# Patient Record
Sex: Female | Born: 1980 | Race: Black or African American | Hispanic: No | Marital: Married | Smoking: Former smoker
Health system: Southern US, Community
[De-identification: ages and names within clinical notes are randomized; demographics above are authoritative.]

## PROBLEM LIST (undated history)

## (undated) ENCOUNTER — Inpatient Hospital Stay (HOSPITAL_COMMUNITY): Payer: Self-pay

## (undated) DIAGNOSIS — J189 Pneumonia, unspecified organism: Secondary | ICD-10-CM

## (undated) DIAGNOSIS — J45909 Unspecified asthma, uncomplicated: Secondary | ICD-10-CM

## (undated) DIAGNOSIS — D649 Anemia, unspecified: Secondary | ICD-10-CM

---

## 2014-01-05 ENCOUNTER — Encounter (HOSPITAL_COMMUNITY): Payer: Self-pay | Admitting: *Deleted

## 2014-01-05 ENCOUNTER — Other Ambulatory Visit: Payer: Self-pay | Admitting: Obstetrics and Gynecology

## 2014-01-09 ENCOUNTER — Encounter (HOSPITAL_COMMUNITY): Payer: Self-pay | Admitting: *Deleted

## 2014-01-09 ENCOUNTER — Encounter (HOSPITAL_COMMUNITY): Payer: Medicaid Other | Admitting: Anesthesiology

## 2014-01-09 ENCOUNTER — Inpatient Hospital Stay (HOSPITAL_COMMUNITY): Payer: Medicaid Other | Admitting: Anesthesiology

## 2014-01-09 ENCOUNTER — Encounter (HOSPITAL_COMMUNITY): Payer: Self-pay | Admitting: Pharmacist

## 2014-01-09 ENCOUNTER — Ambulatory Visit (HOSPITAL_COMMUNITY)
Admission: AD | Admit: 2014-01-09 | Discharge: 2014-01-09 | Disposition: A | Discharge status: Home / Self Care | Payer: Medicaid Other | Source: Ambulatory Visit | Attending: Obstetrics and Gynecology | Admitting: Obstetrics and Gynecology

## 2014-01-09 ENCOUNTER — Encounter (HOSPITAL_COMMUNITY)
Admission: AD | Disposition: A | Discharge status: Home / Self Care | Payer: Self-pay | Source: Ambulatory Visit | Attending: Obstetrics and Gynecology

## 2014-01-09 DIAGNOSIS — O021 Missed abortion: Secondary | ICD-10-CM | POA: Insufficient documentation

## 2014-01-09 DIAGNOSIS — Z87891 Personal history of nicotine dependence: Secondary | ICD-10-CM | POA: Diagnosis not present

## 2014-01-09 HISTORY — PX: DILATION AND EVACUATION: SHX1459

## 2014-01-09 SURGERY — DILATION AND EVACUATION, UTERUS
Anesthesia: Monitor Anesthesia Care | Site: Vagina

## 2014-01-09 MED ORDER — OXYCODONE-ACETAMINOPHEN 5-325 MG PO TABS
2.0000 | ORAL_TABLET | ORAL | Status: DC | PRN
  Administered 2014-01-09: 2 via ORAL

## 2014-01-09 MED ORDER — LIDOCAINE HCL (CARDIAC) 20 MG/ML IV SOLN
INTRAVENOUS | Status: AC
  Filled 2014-01-09: qty 5

## 2014-01-09 MED ORDER — MIDAZOLAM HCL 2 MG/2ML IJ SOLN: 0.5000 mg | Freq: Once | INTRAMUSCULAR | Status: DC | PRN

## 2014-01-09 MED ORDER — LIDOCAINE HCL 1 % IJ SOLN
INTRAMUSCULAR | Status: DC | PRN
  Administered 2014-01-09: 10 mL

## 2014-01-09 MED ORDER — ONDANSETRON HCL 4 MG/2ML IJ SOLN
INTRAMUSCULAR | Status: DC | PRN
  Administered 2014-01-09: 4 mg via INTRAVENOUS

## 2014-01-09 MED ORDER — FENTANYL CITRATE 0.05 MG/ML IJ SOLN
INTRAMUSCULAR | Status: AC
  Filled 2014-01-09: qty 2

## 2014-01-09 MED ORDER — DEXAMETHASONE SODIUM PHOSPHATE 10 MG/ML IJ SOLN
INTRAMUSCULAR | Status: AC
  Filled 2014-01-09: qty 1

## 2014-01-09 MED ORDER — KETOROLAC TROMETHAMINE 30 MG/ML IJ SOLN
15.0000 mg | Freq: Once | INTRAMUSCULAR | Status: AC | PRN
  Administered 2014-01-09: 30 mg via INTRAVENOUS

## 2014-01-09 MED ORDER — OXYCODONE-ACETAMINOPHEN 5-325 MG PO TABS
ORAL_TABLET | ORAL | Status: AC
  Filled 2014-01-09: qty 2

## 2014-01-09 MED ORDER — FENTANYL CITRATE 0.05 MG/ML IJ SOLN
INTRAMUSCULAR | Status: DC | PRN
  Administered 2014-01-09: 100 ug via INTRAVENOUS

## 2014-01-09 MED ORDER — PROMETHAZINE HCL 25 MG/ML IJ SOLN: 6.2500 mg | INTRAMUSCULAR | Status: DC | PRN

## 2014-01-09 MED ORDER — LACTATED RINGERS IV SOLN
INTRAVENOUS | Status: DC
  Administered 2014-01-09 (×2): via INTRAVENOUS

## 2014-01-09 MED ORDER — DEXAMETHASONE SODIUM PHOSPHATE 4 MG/ML IJ SOLN
INTRAMUSCULAR | Status: DC | PRN
  Administered 2014-01-09: 10 mg via INTRAVENOUS

## 2014-01-09 MED ORDER — ONDANSETRON HCL 4 MG/2ML IJ SOLN
INTRAMUSCULAR | Status: AC
  Filled 2014-01-09: qty 2

## 2014-01-09 MED ORDER — KETOROLAC TROMETHAMINE 30 MG/ML IJ SOLN
INTRAMUSCULAR | Status: AC
  Administered 2014-01-09: 30 mg via INTRAVENOUS
  Filled 2014-01-09: qty 1

## 2014-01-09 MED ORDER — PROPOFOL 10 MG/ML IV EMUL
INTRAVENOUS | Status: AC
  Filled 2014-01-09: qty 20

## 2014-01-09 MED ORDER — PROPOFOL 10 MG/ML IV BOLUS
INTRAVENOUS | Status: DC | PRN
  Administered 2014-01-09 (×2): 30 mg via INTRAVENOUS
  Administered 2014-01-09: 40 mg via INTRAVENOUS
  Administered 2014-01-09 (×2): 30 mg via INTRAVENOUS
  Administered 2014-01-09: 20 mg via INTRAVENOUS
  Administered 2014-01-09: 40 mg via INTRAVENOUS
  Administered 2014-01-09: 20 mg via INTRAVENOUS
  Administered 2014-01-09: 60 mg via INTRAVENOUS

## 2014-01-09 MED ORDER — MIDAZOLAM HCL 5 MG/5ML IJ SOLN
INTRAMUSCULAR | Status: DC | PRN
  Administered 2014-01-09 (×2): 1 mg via INTRAVENOUS

## 2014-01-09 MED ORDER — LIDOCAINE HCL (CARDIAC) 20 MG/ML IV SOLN
INTRAVENOUS | Status: DC | PRN
  Administered 2014-01-09: 40 mg via INTRAVENOUS
  Administered 2014-01-09: 20 mg via INTRAVENOUS

## 2014-01-09 MED ORDER — IBUPROFEN 600 MG PO TABS: 600.0000 mg | ORAL_TABLET | Freq: Four times a day (QID) | ORAL | Status: DC | PRN

## 2014-01-09 MED ORDER — FENTANYL CITRATE 0.05 MG/ML IJ SOLN
25.0000 ug | INTRAMUSCULAR | Status: DC | PRN
Start: 2014-01-09 — End: 2014-01-10
  Administered 2014-01-09: 50 ug via INTRAVENOUS

## 2014-01-09 MED ORDER — OXYCODONE-ACETAMINOPHEN 5-325 MG PO TABS: 2.0000 | ORAL_TABLET | ORAL | Status: DC | PRN

## 2014-01-09 MED ORDER — MEPERIDINE HCL 25 MG/ML IJ SOLN: 6.2500 mg | INTRAMUSCULAR | Status: DC | PRN

## 2014-01-09 MED ORDER — MIDAZOLAM HCL 2 MG/2ML IJ SOLN
INTRAMUSCULAR | Status: AC
  Filled 2014-01-09: qty 2

## 2014-01-09 SURGICAL SUPPLY — 17 items
CATH ROBINSON RED A/P 16FR (CATHETERS) ×2 IMPLANT
CLOTH BEACON ORANGE TIMEOUT ST (SAFETY) ×2 IMPLANT
DECANTER SPIKE VIAL GLASS SM (MISCELLANEOUS) ×2 IMPLANT
DILATOR CANAL MILEX (MISCELLANEOUS) IMPLANT
GLOVE BIO SURGEON STRL SZ7 (GLOVE) ×2 IMPLANT
GOWN STRL REUS W/TWL LRG LVL3 (GOWN DISPOSABLE) ×4 IMPLANT
KIT BERKELEY 1ST TRIMESTER 3/8 (MISCELLANEOUS) ×2 IMPLANT
NS IRRIG 1000ML POUR BTL (IV SOLUTION) ×2 IMPLANT
PACK VAGINAL MINOR WOMEN LF (CUSTOM PROCEDURE TRAY) ×2 IMPLANT
PAD OB MATERNITY 4.3X12.25 (PERSONAL CARE ITEMS) ×2 IMPLANT
PAD PREP 24X48 CUFFED NSTRL (MISCELLANEOUS) ×2 IMPLANT
SET BERKELEY SUCTION TUBING (SUCTIONS) ×2 IMPLANT
TOWEL OR 17X24 6PK STRL BLUE (TOWEL DISPOSABLE) ×4 IMPLANT
VACURETTE 10 RIGID CVD (CANNULA) ×2 IMPLANT
VACURETTE 7MM CVD STRL WRAP (CANNULA) IMPLANT
VACURETTE 8 RIGID CVD (CANNULA) IMPLANT
VACURETTE 9 RIGID CVD (CANNULA) IMPLANT

## 2014-01-09 NOTE — MAU Provider Note (Signed)
Chief Complaint: Vaginal Bleeding and Abdominal Pain   First Provider Initiated Contact with Patient 01/09/14 1742     SUBJECTIVE HPI: Peggy Nguyen is a 33 y.o. G1P0 at 3987w5d by LMP who presents with onset dark red vaginal bleeding and lower abdominal cramping 3 hrs PTA. Known to have 10 wk size embryonic demise by office scan 01/05/14. Last ate at 0900 and has had only sips of water since then. Has D&E scheduled for 01/12/14; would like to proceed with D&E sooner.   Past Medical History  Diagnosis Date  . Medical history non-contributory    OB History  Gravida Para Term Preterm AB SAB TAB Ectopic Multiple Living  1             # Outcome Date GA Lbr Len/2nd Weight Sex Delivery Anes PTL Lv  1 CUR              Past Surgical History  Procedure Laterality Date  . No past surgeries     History   Social History  . Marital Status: Divorced    Spouse Name: N/A    Number of Children: N/A  . Years of Education: N/A   Occupational History  . Not on file.   Social History Main Topics  . Smoking status: Former Smoker -- 0.50 packs/day for 5 years    Types: Cigarettes    Quit date: 11/03/2013  . Smokeless tobacco: Never Used  . Alcohol Use: No  . Drug Use: No  . Sexual Activity: Yes    Birth Control/ Protection: None     Comment: approx [redacted] wks gestation   Other Topics Concern  . Not on file   Social History Narrative  . No narrative on file   No current facility-administered medications on file prior to encounter.   Current Outpatient Prescriptions on File Prior to Encounter  Medication Sig Dispense Refill  . acetaminophen (TYLENOL) 500 MG tablet Take 1,000 mg by mouth every 6 (six) hours as needed for moderate pain.       No Known Allergies  ROS: Pertinent items in HPI  OBJECTIVE Blood pressure 126/58, pulse 93, temperature 98.7 F (37.1 C), temperature source Oral, resp. rate 18, height 4' 11.5" (1.511 m), weight 71.385 kg (157 lb 6 oz), last menstrual period  10/12/2013. GENERAL: Well-developed, well-nourished female in no acute distress.  HEENT: Normocephalic HEART: normal rate RESP: normal effort ABDOMEN: Soft, non-tender EXTREMITIES: Nontender, no edema NEURO: Alert and oriented SPECULUM EXAM: NEFG, small amount dark blood , no active bleeding SVE: L/C uterus 8 wk size, NT  LAB RESULTS No results found for this or any previous visit (from the past 24 hour(s)).  IMAGING Bedside scan > embryo with no HR in IUGS  MAU COURSE Consulted Dr. Tenny Crawoss who will do D&E  ASSESSMENT 1. Missed ab     PLAN D&E   Danae OrleansDeirdre C Poe, CNM 01/09/2014  5:43 PM

## 2014-01-09 NOTE — Anesthesia Preprocedure Evaluation (Addendum)
Anesthesia Evaluation  Patient identified by MRN, date of birth, ID band Patient awake    Reviewed: Allergy & Precautions, H&P , Patient's Chart, lab work & pertinent test results, reviewed documented beta blocker date and time   History of Anesthesia Complications Negative for: history of anesthetic complications  Airway Mallampati: II  TM Distance: >3 FB Neck ROM: full    Dental   Pulmonary former smoker,  breath sounds clear to auscultation        Cardiovascular Exercise Tolerance: Good Rhythm:regular Rate:Normal     Neuro/Psych negative psych ROS   GI/Hepatic   Endo/Other    Renal/GU      Musculoskeletal   Abdominal   Peds  Hematology   Anesthesia Other Findings   Reproductive/Obstetrics                             Anesthesia Physical Anesthesia Plan  ASA: II  Anesthesia Plan: MAC   Post-op Pain Management:    Induction:   Airway Management Planned:   Additional Equipment:   Intra-op Plan:   Post-operative Plan:   Informed Consent: I have reviewed the patients History and Physical, chart, labs and discussed the procedure including the risks, benefits and alternatives for the proposed anesthesia with the patient or authorized representative who has indicated his/her understanding and acceptance.   Dental Advisory Given  Plan Discussed with: CRNA, Surgeon and Anesthesiologist  Anesthesia Plan Comments:         Anesthesia Quick Evaluation  

## 2014-01-09 NOTE — MAU Note (Signed)
Patient presents with complaints of vaginal bleeding since 3pm today and abdominal cramping since 3:30pm today.

## 2014-01-09 NOTE — Op Note (Signed)
Pre-Operative Diagnosis: 1) 10 week Missed abortion Postoperative Diagnosis: 2) Same Procedure: Suction Dilation and Evacuation Surgeon: Dr. Waynard ReedsKendra Edon Hoadley Assistant: None Operative Findings: Products of conception Specimen:Products of conception EBL: Total I/O In: 1000 [I.V.:1000] Out: 175 [Urine:150; Blood:25]   Ms. Peggy Nguyen Is a 33 year old gravida 1 para 0 who presents for definitive surgical management for a 10 week missed abortion. Please see the patient's history and physical for complete details of the history. Management options were discussed with the patient. R/B/A reviewed. Following appropriate informed consent was taken to the operating room. The patient was appropriately identified during a time out procedure. MAC anesthesia was administered and the patient was placed in the dorsal lithotomy position. The patient was prepped and draped in the normal sterile fashion. A speculum was placed into the vagina, a single-tooth tenaculum was placed on the anterior lip of the cervix, and 10 cc of 1% lidocaine was administered in a paracervical fashion. The cervix was serially dilated with Shawnie PonsPratt dilators. A #10 suction curet was then passed to the fundus, the vacuum was engaged, and 3 suction passes were performed with a curette. A Sharp curettage was performed and a gritty texture was noted. A final suction pass was performed with minimal results. This completed the procedure. The patient tolerated the procedure well was brought to the recovery room in stable condition for the procedure. All sponge and needle counts correct x2.

## 2014-01-09 NOTE — Anesthesia Postprocedure Evaluation (Signed)
  Anesthesia Post-op Note  Anesthesia Post Note  Patient: Peggy Nguyen  Procedure(s) Performed: Procedure(s) (LRB): DILATATION AND EVACUATION (N/A)  Anesthesia type: MAC  Patient location: PACU  Post pain: Pain level controlled  Post assessment: Post-op Vital signs reviewed  Last Vitals:  Filed Vitals:   01/09/14 2100  BP: 116/79  Pulse: 90  Temp: 37.2 C  Resp: 18    Post vital signs: Reviewed  Level of consciousness: sedated  Complications: No apparent anesthesia complications

## 2014-01-09 NOTE — H&P (Signed)
Peggy Nguyen is an 33 y.o. female Presents for vaginal bleeding   33 year old gravida one pair zero African-American female presents to maternity admissions for evaluation  Of vaginal bleeding and cramping. She was seen and our office on  Monday, August 3. A transvaginal ultrasound demonstrated in intrauterine pregnancy measuring approximately 10 weeks however no fetal cardiac activity was visualized. This ultrasound findings were consistent with a missed abortion. Risks, benefits, alternatives of management options were discussed with the patient and she elected for surgical management, however due to work restrictions she was unable to have her surgery performed this week and she is scheduled for next week. Throughout the day today she has had increased abdominal cramping, vaginal pain, pain and she presented to the turned emissions for evaluation of finding. Patient wishes to proceed with any time. Risks, benefits, alternative procedure would definitely   Patient's last menstrual period was 10/12/2013.    Past Medical History  Diagnosis Date  . Medical history non-contributory     Past Surgical History  Procedure Laterality Date  . No past surgeries      History reviewed. No pertinent family history.  Social History:  reports that she quit smoking about 2 months ago. Her smoking use included Cigarettes. She has a 2.5 pack-year smoking history. She has never used smokeless tobacco. She reports that she does not drink alcohol or use illicit drugs.  Allergies: No Known Allergies  Prescriptions prior to admission  Medication Sig Dispense Refill  . acetaminophen (TYLENOL) 500 MG tablet Take 1,000 mg by mouth every 6 (six) hours as needed for moderate pain.      . Prenatal Vit-Fe Fumarate-FA (PRENATAL MULTIVITAMIN) TABS tablet Take 1 tablet by mouth daily.        ROS: as above  Blood pressure 126/58, pulse 93, temperature 98.7 F (37.1 C), temperature source Oral, resp. rate 18, height  4' 11.5" (1.511 m), weight 71.385 kg (157 lb 6 oz), last menstrual period 10/12/2013. Physical Exam  AOX#, anxious appearing Vaginal bleeding noted on exam  BT O positive Hg 11.9   No results found for this or any previous visit (from the past 24 hour(s)).  No results found.  Assessment/Plan:  33 year old G1 P0 at 10 weeks with a missed abortion for suction dilation and curettage 1) Admit 2) Consent for procedure, R/B/A reviewed  Patient did not wish to have the products of conception sent for genetic testing. 3) SCDS  Thomasa Heidler H. 01/09/2014, 6:15 PM

## 2014-01-09 NOTE — Transfer of Care (Signed)
Immediate Anesthesia Transfer of Care Note  Patient: Peggy Nguyen  Procedure(s) Performed: Procedure(s): DILATATION AND EVACUATION (N/A)  Patient Location: PACU  Anesthesia Type:MAC  Level of Consciousness: awake and sedated  Airway & Oxygen Therapy: Patient Spontanous Breathing and Patient connected to nasal cannula oxygen  Post-op Assessment: Report given to PACU RN and Post -op Vital signs reviewed and stable  Post vital signs: Reviewed and stable  Complications: No apparent anesthesia complications

## 2014-01-12 ENCOUNTER — Encounter (HOSPITAL_COMMUNITY): Payer: Self-pay | Admitting: Anesthesiology

## 2014-01-12 ENCOUNTER — Ambulatory Visit (HOSPITAL_COMMUNITY)
Admission: RE | Admit: 2014-01-12 | Payer: Medicaid Other | Source: Ambulatory Visit | Admitting: Obstetrics and Gynecology

## 2014-01-12 ENCOUNTER — Encounter (HOSPITAL_COMMUNITY): Payer: Self-pay | Admitting: Obstetrics and Gynecology

## 2014-01-12 SURGERY — DILATION AND EVACUATION, UTERUS
Anesthesia: Choice

## 2014-02-10 ENCOUNTER — Other Ambulatory Visit: Payer: Self-pay | Admitting: Obstetrics and Gynecology

## 2014-02-11 LAB — CYTOLOGY - PAP

## 2014-04-07 ENCOUNTER — Encounter (HOSPITAL_COMMUNITY): Payer: Self-pay | Admitting: Obstetrics and Gynecology

## 2014-04-17 ENCOUNTER — Encounter (HOSPITAL_COMMUNITY): Payer: Self-pay | Admitting: *Deleted

## 2014-04-17 ENCOUNTER — Emergency Department (HOSPITAL_COMMUNITY): Payer: Medicaid Other

## 2014-04-17 ENCOUNTER — Emergency Department (HOSPITAL_COMMUNITY)
Admission: EM | Admit: 2014-04-17 | Discharge: 2014-04-17 | Disposition: A | Discharge status: Home / Self Care | Payer: Medicaid Other | Attending: Emergency Medicine | Admitting: Emergency Medicine

## 2014-04-17 DIAGNOSIS — R062 Wheezing: Secondary | ICD-10-CM

## 2014-04-17 DIAGNOSIS — R05 Cough: Secondary | ICD-10-CM

## 2014-04-17 DIAGNOSIS — J45901 Unspecified asthma with (acute) exacerbation: Secondary | ICD-10-CM | POA: Diagnosis not present

## 2014-04-17 DIAGNOSIS — Z87891 Personal history of nicotine dependence: Secondary | ICD-10-CM | POA: Insufficient documentation

## 2014-04-17 DIAGNOSIS — Z793 Long term (current) use of hormonal contraceptives: Secondary | ICD-10-CM | POA: Diagnosis not present

## 2014-04-17 DIAGNOSIS — R059 Cough, unspecified: Secondary | ICD-10-CM

## 2014-04-17 MED ORDER — ALBUTEROL SULFATE HFA 108 (90 BASE) MCG/ACT IN AERS
2.0000 | INHALATION_SPRAY | Freq: Once | RESPIRATORY_TRACT | Status: AC
  Administered 2014-04-17: 2 via RESPIRATORY_TRACT
  Filled 2014-04-17: qty 6.7

## 2014-04-17 NOTE — ED Provider Notes (Signed)
CSN: [id number]     Arrival date & time 04/17/14  [id number] History   First MD Initiated Contact with Patient 04/17/14 [phone]     Chief Complaint  Patient presents with  . Cough  . Nasal Congestion  . Wheezing     (Consider location/radiation/quality/duration/timing/severity/associated sxs/prior Treatment) HPI Comments: 33 year old female with asthma as a child, no significant medical history, past smoker presents with cough nonproductive and wheezing worsening for the past week. No previous history recalled except for possibly the child. Patient has no cardiac history or blood clot history, no recent major surgeries, no significant sick contact.worse at night, congested. Low-grade fever. Patient currently not on antibiotics or steroids.  Patient is a 33 y.o. female presenting with cough and wheezing. The history is provided by the patient.  Cough Associated symptoms: wheezing   Associated symptoms: no chest pain, no chills, no fever and no headaches   Wheezing Associated symptoms: cough   Associated symptoms: no chest pain, no fever and no headaches     Past Medical History  Diagnosis Date  . Medical history non-contributory    Past Surgical History  Procedure Laterality Date  . No past surgeries    . Dilation and evacuation N/A 01/09/2014    Procedure: DILATATION AND EVACUATION;  Surgeon: Freddrick MarchKendra H. Tenny Crawoss, MD;  Location: [facility];  Service: Gynecology;  Laterality: N/A;   No family history on file. History  Substance Use Topics  . Smoking status: Former Smoker -- 0.50 packs/day for 5 years    Types: Cigarettes    Quit date: 11/03/2013  . Smokeless tobacco: Never Used  . Alcohol Use: No   OB History    Gravida Para Term Preterm AB TAB SAB Ectopic Multiple Living   1              Review of Systems  Constitutional: Negative for fever and chills.  HENT: Positive for congestion.   Respiratory: Positive for cough and wheezing.   Cardiovascular: Negative for chest pain and leg  swelling.  Gastrointestinal: Negative for vomiting.  Neurological: Negative for headaches.      Allergies  Review of patient's allergies indicates no known allergies.  Home Medications   Prior to Admission medications   Medication Sig Start Date End Date Taking? Authorizing Provider  acetaminophen (TYLENOL) 500 MG tablet Take 1,000 mg by mouth every 6 (six) hours as needed for moderate pain or headache.    Yes Historical Provider, MD  Norethindrone Acetate-Ethinyl Estrad-FE (LOESTRIN 24 FE) 1-20 MG-MCG(24) tablet Take 1 tablet by mouth daily.   Yes Historical Provider, MD   BP 136/78 mmHg  Pulse 86  Temp(Src) 97.9 F (36.6 C) (Oral)  Resp 18  SpO2 100%  LMP 04/09/2014  Breastfeeding? Unknown Physical Exam  Constitutional: She is oriented to person, place, and time. She appears well-developed and well-nourished.  HENT:  Head: Normocephalic and atraumatic.  Eyes: Conjunctivae are normal. Right eye exhibits no discharge. Left eye exhibits no discharge.  Neck: Normal range of motion. Neck supple. No tracheal deviation present.  Cardiovascular: Normal rate and regular rhythm.   Pulmonary/Chest: Effort normal. She has wheezes (expiratory wheeze bilateral, no retractions).  Abdominal: Soft. She exhibits no distension.  Musculoskeletal: She exhibits no edema.  Neurological: She is alert and oriented to person, place, and time.  Skin: Skin is warm. No rash noted.  Psychiatric: She has a normal mood and affect.  Nursing note and vitals reviewed.   ED Course  Procedures (including critical care time) Labs  Review Labs Reviewed - No data to display  Imaging Review Dg Chest 2 View  04/17/2014   CLINICAL DATA:  Shortness of breath, dry cough  EXAM: CHEST  2 VIEW  COMPARISON:  None.  FINDINGS: Lungs are clear.  No pleural effusion or pneumothorax.  The heart is normal in size.  Visualized osseous structures are within normal limits.  IMPRESSION: Normal chest radiographs.    Electronically Signed   By: Charline BillsSriyesh  Krishnan M.D.   On: 04/17/2014 07:48     EKG Interpretation None      MDM   Final diagnoses:  Cough  Wheezing   Well-appearing femalechildhood asthma presents with wheezing and cough for a week. Clinically discussed likely viral/bacterial with bronchospasm versus recurrence of asthma.  Patient given inhaler in the ER. Patient has no history of chest x-ray, chest x-ray reviewed no acute findings.  Results and differential diagnosis were discussed with the patient/parent/guardian. Close follow up outpatient was discussed, comfortable with the plan.   Medications  albuterol (PROVENTIL HFA;VENTOLIN HFA) 108 (90 BASE) MCG/ACT inhaler 2 puff (2 puffs Inhalation Given 04/17/14 0722)    Filed Vitals:   04/17/14 0703 04/17/14 0722 04/17/14 0725  BP: 136/78    Pulse: 86  86  Temp: 97.9 F (36.6 C)    TempSrc: Oral    Resp: 18  18  SpO2: 100% 100% 100%    Final diagnoses:  Cough  Wheezing        Enid SkeensJoshua M Winfield Caba, MD 04/17/14 [phone]

## 2014-04-17 NOTE — ED Notes (Signed)
Pt reports non-productive cough, nasal congestion and wheezing since Monday, sts hx of asthma in past, didn't use inhaler in over 8-9 years. Pt in NAD in triage, speaking in full sentences

## 2014-04-17 NOTE — Discharge Instructions (Signed)
Use inhaler every 3-4 hours as needed. If breathing worsens or new concerns come back to the ER see a primary care doctor.  If you were given medicines take as directed.  If you are on coumadin or contraceptives realize their levels and effectiveness is altered by many different medicines.  If you have any reaction (rash, tongues swelling, other) to the medicines stop taking and see a physician.   Please follow up as directed and return to the ER or see a physician for new or worsening symptoms.  Thank you. Filed Vitals:   04/17/14 0703 04/17/14 0722 04/17/14 0725  BP: 136/78    Pulse: 86  86  Temp: 97.9 F (36.6 C)    TempSrc: Oral    Resp: 18  18  SpO2: 100% 100% 100%

## 2014-09-23 ENCOUNTER — Other Ambulatory Visit: Payer: Self-pay | Admitting: Obstetrics and Gynecology

## 2014-10-03 ENCOUNTER — Inpatient Hospital Stay (HOSPITAL_COMMUNITY): Payer: Medicaid Other

## 2014-10-03 ENCOUNTER — Encounter (HOSPITAL_COMMUNITY): Payer: Self-pay | Admitting: *Deleted

## 2014-10-03 ENCOUNTER — Inpatient Hospital Stay (HOSPITAL_COMMUNITY)
Admission: AD | Admit: 2014-10-03 | Discharge: 2014-10-03 | Disposition: A | Discharge status: Home / Self Care | Payer: Medicaid Other | Source: Ambulatory Visit | Attending: Obstetrics | Admitting: Obstetrics

## 2014-10-03 DIAGNOSIS — O09291 Supervision of pregnancy with other poor reproductive or obstetric history, first trimester: Secondary | ICD-10-CM

## 2014-10-03 DIAGNOSIS — O26899 Other specified pregnancy related conditions, unspecified trimester: Secondary | ICD-10-CM

## 2014-10-03 DIAGNOSIS — Z87891 Personal history of nicotine dependence: Secondary | ICD-10-CM | POA: Diagnosis not present

## 2014-10-03 DIAGNOSIS — O9989 Other specified diseases and conditions complicating pregnancy, childbirth and the puerperium: Secondary | ICD-10-CM | POA: Insufficient documentation

## 2014-10-03 DIAGNOSIS — Z3A08 8 weeks gestation of pregnancy: Secondary | ICD-10-CM | POA: Insufficient documentation

## 2014-10-03 DIAGNOSIS — R109 Unspecified abdominal pain: Secondary | ICD-10-CM | POA: Diagnosis not present

## 2014-10-03 DIAGNOSIS — O09521 Supervision of elderly multigravida, first trimester: Secondary | ICD-10-CM | POA: Diagnosis not present

## 2014-10-03 HISTORY — DX: Unspecified asthma, uncomplicated: J45.909

## 2014-10-03 LAB — URINALYSIS, ROUTINE W REFLEX MICROSCOPIC
Bilirubin Urine: NEGATIVE
GLUCOSE, UA: NEGATIVE mg/dL
Hgb urine dipstick: NEGATIVE
Ketones, ur: NEGATIVE mg/dL
LEUKOCYTES UA: NEGATIVE
NITRITE: NEGATIVE
PROTEIN: NEGATIVE mg/dL
Specific Gravity, Urine: 1.015 (ref 1.005–1.030)
Urobilinogen, UA: 0.2 mg/dL (ref 0.0–1.0)
pH: 6.5 (ref 5.0–8.0)

## 2014-10-03 LAB — WET PREP, GENITAL
CLUE CELLS WET PREP: NONE SEEN
Trich, Wet Prep: NONE SEEN
Yeast Wet Prep HPF POC: NONE SEEN

## 2014-10-03 LAB — POCT PREGNANCY, URINE: Preg Test, Ur: POSITIVE — AB

## 2014-10-03 NOTE — MAU Note (Signed)
Was seen on Wed at dr's office for this preg. last couple days has been having some really bad cramping.  Paranoid, with last preg had a miscarriage

## 2014-10-03 NOTE — MAU Provider Note (Signed)
History     CSN: [id number]  Arrival date and time: 10/03/14 1114   First Provider Initiated Contact with Patient 10/03/14 1155      Chief Complaint  Patient presents with  . Abdominal Cramping   HPI 34 y.o. G2P0010 at [redacted] weeks EGA w/ cramping x 2 days, states it comes and goes, mostly right sided, no bleeding or discharge. Normal pelvic exam and u/s in office last week. H/O SAB around 10 weeks in last pregnancy.   Past Medical History  Diagnosis Date  . Medical history non-contributory   . Asthma     childhood    Past Surgical History  Procedure Laterality Date  . No past surgeries    . Dilation and evacuation N/A 01/09/2014    Procedure: DILATATION AND EVACUATION;  Surgeon: Freddrick March. Tenny Craw, MD;  Location: [facility];  Service: Gynecology;  Laterality: N/A;  . Dilation and curettage of uterus      Family History  Problem Relation Age of Onset  . Asthma Mother   . Lupus Sister   . Cancer Maternal Grandfather     prostate  . Diabetes Paternal Grandfather   . Hearing loss Neg Hx     History  Substance Use Topics  . Smoking status: Former Smoker -- 0.50 packs/day for 5 years    Types: Cigarettes    Quit date: 11/03/2013  . Smokeless tobacco: Never Used  . Alcohol Use: No    Allergies:  Allergies  Allergen Reactions  . Shellfish Allergy Swelling    No prescriptions prior to admission    Review of Systems  Constitutional: Negative.   Respiratory: Negative.   Cardiovascular: Negative.   Gastrointestinal: Negative for nausea, vomiting, abdominal pain, diarrhea and constipation.  Genitourinary: Negative for dysuria, urgency, frequency, hematuria and flank pain.       Negative for vaginal bleeding, + cramping  Musculoskeletal: Negative.   Neurological: Negative.   Psychiatric/Behavioral: Negative.    Physical Exam   Blood pressure 112/60, pulse 78, resp. rate 16, height 4' 11.5" (1.511 m), weight 162 lb (73.483 kg), last menstrual period 08/05/2014, unknown if  currently breastfeeding.  Physical Exam  Constitutional: She is oriented to person, place, and time. She appears well-developed and well-nourished. No distress.  HENT:  Head: Normocephalic and atraumatic.  Cardiovascular: Normal rate, regular rhythm and normal heart sounds.   Respiratory: Effort normal and breath sounds normal. No respiratory distress.  GI: Soft. Bowel sounds are normal. She exhibits no distension and no mass. There is no tenderness. There is no rebound and no guarding.  Genitourinary: There is no rash or lesion on the right labia. There is no rash or lesion on the left labia. Uterus is not deviated, not enlarged, not fixed and not tender. Cervix exhibits no motion tenderness, no discharge and no friability. Right adnexum displays no mass, no tenderness and no fullness. Left adnexum displays no mass, no tenderness and no fullness. No erythema, tenderness or bleeding in the vagina. Vaginal discharge (white, no odor) found.  Cervix closed   Neurological: She is alert and oriented to person, place, and time.  Skin: Skin is warm and dry.  Psychiatric: She has a normal mood and affect.    MAU Course  Procedures  Results for orders placed or performed during the hospital encounter of 10/03/14 (from the past 24 hour(s))  Pregnancy, urine POC     Status: Abnormal   Collection Time: 10/03/14 11:39 AM  Result Value Ref Range   Preg Test,  Ur POSITIVE (A) NEGATIVE  Urinalysis, Routine w reflex microscopic     Status: None   Collection Time: 10/03/14 11:40 AM  Result Value Ref Range   Color, Urine YELLOW YELLOW   APPearance CLEAR CLEAR   Specific Gravity, Urine 1.015 1.005 - 1.030   pH 6.5 5.0 - 8.0   Glucose, UA NEGATIVE NEGATIVE mg/dL   Hgb urine dipstick NEGATIVE NEGATIVE   Bilirubin Urine NEGATIVE NEGATIVE   Ketones, ur NEGATIVE NEGATIVE mg/dL   Protein, ur NEGATIVE NEGATIVE mg/dL   Urobilinogen, UA 0.2 0.0 - 1.0 mg/dL   Nitrite NEGATIVE NEGATIVE   Leukocytes, UA  NEGATIVE NEGATIVE  Wet prep, genital     Status: Abnormal   Collection Time: 10/03/14 12:00 PM  Result Value Ref Range   Yeast Wet Prep HPF POC NONE SEEN NONE SEEN   Trich, Wet Prep NONE SEEN NONE SEEN   Clue Cells Wet Prep HPF POC NONE SEEN NONE SEEN   WBC, Wet Prep HPF POC FEW (A) NONE SEEN   US Ob Comp Less 14 Wks  10/03/2014   CLINICAL DATA:  Advanced maternal age. Previous SAB. Cramping. LMP 08/05/2014. Gestational age by LMP 8 weeks 3 days. EDC by LMP 05/12/2015.  EXAM: OBSTETRIC <14 WK [place] AND TRANSVAGINAL OB US  TECHNIQUE: Both transabdominal and transvaginal ultrasound examinations were performed for complete evaluation of the gestation as well as the maternal uterus, adnexal regions, and pelvic cul-de-sac. Transvaginal technique was performed to assess early pregnancy.  COMPARISON:  None.  FINDINGS: Intrauterine gestational sac: Present  Yolk sac:  Present  Embryo:  Present  Cardiac Activity: Present  Heart Rate: 172  bpm  CRL:  18.3  mm   8 w   3 d                  [place] EDC: 05/12/2015  Maternal uterus/adnexae: Small subchorionic hemorrhage. The ovaries have a normal appearance. Small right corpus luteum cyst is present. No free pelvic fluid.  IMPRESSION: 1. Single living intrauterine embryo. 2. Size by ultrasound confirms clinical EDC of 05/12/2015. 3. Small subchorionic hemorrhage.   Electronically Signed   By: Norva Pavlov M.D.   On: 10/03/2014 13:02   US Ob Transvaginal  10/03/2014   CLINICAL DATA:  Advanced maternal age. Previous SAB. Cramping. LMP 08/05/2014. Gestational age by LMP 8 weeks 3 days. EDC by LMP 05/12/2015.  EXAM: OBSTETRIC <14 WK [place] AND TRANSVAGINAL OB US  TECHNIQUE: Both transabdominal and transvaginal ultrasound examinations were performed for complete evaluation of the gestation as well as the maternal uterus, adnexal regions, and pelvic cul-de-sac. Transvaginal technique was performed to assess early pregnancy.  COMPARISON:  None.  FINDINGS: Intrauterine gestational  sac: Present  Yolk sac:  Present  Embryo:  Present  Cardiac Activity: Present  Heart Rate: 172  bpm  CRL:  18.3  mm   8 w   3 d                  [place] EDC: 05/12/2015  Maternal uterus/adnexae: Small subchorionic hemorrhage. The ovaries have a normal appearance. Small right corpus luteum cyst is present. No free pelvic fluid.  IMPRESSION: 1. Single living intrauterine embryo. 2. Size by ultrasound confirms clinical EDC of 05/12/2015. 3. Small subchorionic hemorrhage.   Electronically Signed   By: Norva Pavlov M.D.   On: 10/03/2014 13:02     Assessment and Plan   35 y.o. G2P0010 with cramping at 8.[redacted] weeks EGA, history of SAB at 10  weeks Reassuring u/s today F/U in office as scheduled or sooner w/ worsening pain or onset of vaginal bleeding    Medication List    TAKE these medications        acetaminophen 500 MG tablet  Commonly known as:  TYLENOL  Take 1,000 mg by mouth every 6 (six) hours as needed for moderate pain or headache.     prenatal multivitamin Tabs tablet  Take 1 tablet by mouth daily at 12 noon.            Follow-up Information    [facility] LeydenGEFFEL CLARK, MD.   Specialty:  Obstetrics   Why:  as scheduled or sooner as needed   Contact information:   [address] [place] [address] [phone]         Georges MouseFRAZIER,NATALIE 10/03/2014, 2:39 PM

## 2014-10-16 ENCOUNTER — Encounter (HOSPITAL_COMMUNITY): Payer: Self-pay | Admitting: General Practice

## 2014-10-19 ENCOUNTER — Encounter (HOSPITAL_COMMUNITY): Payer: Self-pay | Admitting: Obstetrics

## 2014-10-19 NOTE — H&P (Signed)
34 y.o. G2P0010 @ [redacted]w[redacted]d [facility] by [facility].  Has a confirmed intrauterine pregnancy, but FCA absent X 2 on US in office last week.  Measuring 9 wks by CRL.   Past Medical History  Diagnosis Date  . Asthma     childhood    Past Surgical History  Procedure Laterality Date  . Dilation and evacuation N/A 01/09/2014    Procedure: DILATATION AND EVACUATION;  Surgeon: Freddrick MarchKendra H. Tenny Crawoss, MD;  Location: [facility];  Service: Gynecology;  Laterality: N/A;    OB History  Gravida Para Term Preterm AB SAB TAB Ectopic Multiple Living  2    1 1         # Outcome Date GA Lbr Len/2nd Weight Sex Delivery Anes PTL Lv  2 Current           1 SAB  [id number]             History   Social History  . Marital Status: Single    Spouse Name: N/A  . Number of Children: N/A  . Years of Education: N/A   Occupational History  . Not on file.   Social History Main Topics  . Smoking status: Former Smoker -- 0.50 packs/day for 5 years    Types: Cigarettes    Quit date: 11/03/2013  . Smokeless tobacco: Never Used  . Alcohol Use: No  . Drug Use: No  . Sexual Activity: Yes    Birth Control/ Protection: None     Comment: approx [redacted] wks gestation   Other Topics Concern  . Not on file   Social History Narrative   Shellfish allergy     General:  NAD Abdomen:  soft Ex:  no edema FHTs: absent   A/P   [age] y.o.  G2P0010 @ [redacted]w[redacted]d with missed AB.  Measuring 9 wk on US.  Options discussed with patient to include expectant management, medical management or surgical management.  She desires surgical management.  Discussed risks to include infection, bleeding, damage to surrounding structures, retained products of conception, uterine perforation, need for additional procedures.  Consent signed.  As this is the second missed AB at this gestational age, will plan for genetic testing of POC.   Rh +, rhogam not indicated.   Given second pregnancy loss at same GA, plan w/u for RPL in PP  period  [facility]ANNA GEFFEL [facility]

## 2014-10-20 ENCOUNTER — Ambulatory Visit (HOSPITAL_COMMUNITY): Payer: Medicaid Other | Admitting: Anesthesiology

## 2014-10-20 ENCOUNTER — Encounter (HOSPITAL_COMMUNITY)
Admission: RE | Disposition: A | Discharge status: Home / Self Care | Payer: Self-pay | Source: Ambulatory Visit | Attending: Obstetrics

## 2014-10-20 ENCOUNTER — Ambulatory Visit (HOSPITAL_COMMUNITY)
Admission: RE | Admit: 2014-10-20 | Discharge: 2014-10-20 | Disposition: A | Discharge status: Home / Self Care | Payer: Medicaid Other | Source: Ambulatory Visit | Attending: Obstetrics | Admitting: Obstetrics

## 2014-10-20 ENCOUNTER — Encounter (HOSPITAL_COMMUNITY): Payer: Self-pay | Admitting: Anesthesiology

## 2014-10-20 DIAGNOSIS — J45909 Unspecified asthma, uncomplicated: Secondary | ICD-10-CM | POA: Insufficient documentation

## 2014-10-20 DIAGNOSIS — O021 Missed abortion: Secondary | ICD-10-CM | POA: Diagnosis present

## 2014-10-20 DIAGNOSIS — N854 Malposition of uterus: Secondary | ICD-10-CM | POA: Diagnosis not present

## 2014-10-20 DIAGNOSIS — Z87891 Personal history of nicotine dependence: Secondary | ICD-10-CM | POA: Diagnosis not present

## 2014-10-20 DIAGNOSIS — Z3A10 10 weeks gestation of pregnancy: Secondary | ICD-10-CM | POA: Diagnosis not present

## 2014-10-20 HISTORY — PX: DILATION AND EVACUATION: SHX1459

## 2014-10-20 LAB — TYPE AND SCREEN
ABO/RH(D): O POS
ANTIBODY SCREEN: NEGATIVE

## 2014-10-20 LAB — CBC
HCT: 34.7 % — ABNORMAL LOW (ref 36.0–46.0)
Hemoglobin: 12 g/dL (ref 12.0–15.0)
MCH: 28.2 pg (ref 26.0–34.0)
MCHC: 34.6 g/dL (ref 30.0–36.0)
MCV: 81.5 fL (ref 78.0–100.0)
PLATELETS: 284 10*3/uL (ref 150–400)
RBC: 4.26 MIL/uL (ref 3.87–5.11)
RDW: 13.5 % (ref 11.5–15.5)
WBC: 3.3 10*3/uL — ABNORMAL LOW (ref 4.0–10.5)

## 2014-10-20 LAB — ABO/RH: ABO/RH(D): O POS

## 2014-10-20 SURGERY — DILATION AND EVACUATION, UTERUS
Anesthesia: Choice | Site: Uterus

## 2014-10-20 MED ORDER — LIDOCAINE HCL (CARDIAC) 20 MG/ML IV SOLN
INTRAVENOUS | Status: DC | PRN
  Administered 2014-10-20: 60 mg via INTRAVENOUS

## 2014-10-20 MED ORDER — FENTANYL CITRATE (PF) 100 MCG/2ML IJ SOLN
INTRAMUSCULAR | Status: DC | PRN
  Administered 2014-10-20 (×3): 50 ug via INTRAVENOUS

## 2014-10-20 MED ORDER — OXYCODONE-ACETAMINOPHEN 5-325 MG PO TABS: 1.0000 | ORAL_TABLET | Freq: Four times a day (QID) | ORAL | Status: DC | PRN

## 2014-10-20 MED ORDER — DEXAMETHASONE SODIUM PHOSPHATE 10 MG/ML IJ SOLN
INTRAMUSCULAR | Status: DC | PRN
  Administered 2014-10-20: 4 mg via INTRAVENOUS

## 2014-10-20 MED ORDER — MIDAZOLAM HCL 2 MG/2ML IJ SOLN
INTRAMUSCULAR | Status: AC
  Filled 2014-10-20: qty 2

## 2014-10-20 MED ORDER — SILVER NITRATE-POT NITRATE 75-25 % EX MISC
CUTANEOUS | Status: AC
  Filled 2014-10-20: qty 1

## 2014-10-20 MED ORDER — FENTANYL CITRATE (PF) 250 MCG/5ML IJ SOLN
INTRAMUSCULAR | Status: AC
  Filled 2014-10-20: qty 5

## 2014-10-20 MED ORDER — MIDAZOLAM HCL 2 MG/2ML IJ SOLN
INTRAMUSCULAR | Status: DC | PRN
  Administered 2014-10-20: 2 mg via INTRAVENOUS

## 2014-10-20 MED ORDER — SCOPOLAMINE 1 MG/3DAYS TD PT72: 1.0000 | MEDICATED_PATCH | Freq: Once | TRANSDERMAL | Status: DC

## 2014-10-20 MED ORDER — ACETAMINOPHEN 160 MG/5ML PO SOLN
975.0000 mg | Freq: Once | ORAL | Status: AC
  Administered 2014-10-20: 975 mg via ORAL

## 2014-10-20 MED ORDER — DEXAMETHASONE SODIUM PHOSPHATE 4 MG/ML IJ SOLN
INTRAMUSCULAR | Status: AC
  Filled 2014-10-20: qty 1

## 2014-10-20 MED ORDER — PHENYLEPHRINE 40 MCG/ML (10ML) SYRINGE FOR IV PUSH (FOR BLOOD PRESSURE SUPPORT)
PREFILLED_SYRINGE | INTRAVENOUS | Status: AC
  Filled 2014-10-20: qty 10

## 2014-10-20 MED ORDER — KETOROLAC TROMETHAMINE 30 MG/ML IJ SOLN
INTRAMUSCULAR | Status: AC
  Filled 2014-10-20: qty 1

## 2014-10-20 MED ORDER — ONDANSETRON HCL 4 MG/2ML IJ SOLN
INTRAMUSCULAR | Status: DC | PRN
  Administered 2014-10-20: 4 mg via INTRAVENOUS

## 2014-10-20 MED ORDER — FENTANYL CITRATE (PF) 100 MCG/2ML IJ SOLN: 25.0000 ug | INTRAMUSCULAR | Status: DC | PRN

## 2014-10-20 MED ORDER — LIDOCAINE HCL (CARDIAC) 20 MG/ML IV SOLN
INTRAVENOUS | Status: AC
  Filled 2014-10-20: qty 5

## 2014-10-20 MED ORDER — METHYLENE BLUE 1 % INJ SOLN
INTRAMUSCULAR | Status: AC
  Filled 2014-10-20: qty 10

## 2014-10-20 MED ORDER — DOXYCYCLINE HYCLATE 100 MG PO CAPS: 100.0000 mg | ORAL_CAPSULE | Freq: Two times a day (BID) | ORAL | Status: DC

## 2014-10-20 MED ORDER — IBUPROFEN 600 MG PO TABS: 600.0000 mg | ORAL_TABLET | Freq: Four times a day (QID) | ORAL | Status: DC | PRN

## 2014-10-20 MED ORDER — ACETAMINOPHEN 160 MG/5ML PO SOLN
ORAL | Status: AC
  Administered 2014-10-20: 975 mg via ORAL
  Filled 2014-10-20: qty 40.6

## 2014-10-20 MED ORDER — LIDOCAINE HCL 1 % IJ SOLN
INTRAMUSCULAR | Status: AC
Start: 2014-10-20 — End: 2014-10-20
  Filled 2014-10-20: qty 20

## 2014-10-20 MED ORDER — PROPOFOL 10 MG/ML IV BOLUS
INTRAVENOUS | Status: DC | PRN
  Administered 2014-10-20: 150 mg via INTRAVENOUS

## 2014-10-20 MED ORDER — KETOROLAC TROMETHAMINE 30 MG/ML IJ SOLN
INTRAMUSCULAR | Status: DC | PRN
  Administered 2014-10-20: 30 mg via INTRAVENOUS

## 2014-10-20 MED ORDER — ONDANSETRON HCL 4 MG/2ML IJ SOLN
INTRAMUSCULAR | Status: AC
  Filled 2014-10-20: qty 2

## 2014-10-20 MED ORDER — LACTATED RINGERS IV SOLN
INTRAVENOUS | Status: DC
  Administered 2014-10-20 (×2): via INTRAVENOUS

## 2014-10-20 MED ORDER — PROPOFOL 10 MG/ML IV BOLUS
INTRAVENOUS | Status: AC
  Filled 2014-10-20: qty 40

## 2014-10-20 MED ORDER — LIDOCAINE HCL 1 % IJ SOLN
INTRAMUSCULAR | Status: DC | PRN
  Administered 2014-10-20: 10 mL

## 2014-10-20 SURGICAL SUPPLY — 19 items
CATH ROBINSON RED A/P 16FR (CATHETERS) ×3 IMPLANT
CLOTH BEACON ORANGE TIMEOUT ST (SAFETY) ×3 IMPLANT
CONT PATH 16OZ SNAP LID 3702 (MISCELLANEOUS) ×3 IMPLANT
DECANTER SPIKE VIAL GLASS SM (MISCELLANEOUS) ×3 IMPLANT
DILATOR CANAL MILEX (MISCELLANEOUS) IMPLANT
GLOVE BIO SURGEON STRL SZ 6 (GLOVE) ×3 IMPLANT
GLOVE INDICATOR 6.5 STRL GRN (GLOVE) ×6 IMPLANT
GOWN STRL REUS W/TWL LRG LVL3 (GOWN DISPOSABLE) ×6 IMPLANT
KIT BERKELEY 1ST TRIMESTER 3/8 (MISCELLANEOUS) ×3 IMPLANT
NS IRRIG 1000ML POUR BTL (IV SOLUTION) ×3 IMPLANT
PACK VAGINAL MINOR WOMEN LF (CUSTOM PROCEDURE TRAY) ×3 IMPLANT
PAD OB MATERNITY 4.3X12.25 (PERSONAL CARE ITEMS) ×3 IMPLANT
PAD PREP 24X48 CUFFED NSTRL (MISCELLANEOUS) ×3 IMPLANT
SET BERKELEY SUCTION TUBING (SUCTIONS) ×3 IMPLANT
TOWEL OR 17X24 6PK STRL BLUE (TOWEL DISPOSABLE) ×6 IMPLANT
VACURETTE 10 RIGID CVD (CANNULA) ×3 IMPLANT
VACURETTE 7MM CVD STRL WRAP (CANNULA) IMPLANT
VACURETTE 8 RIGID CVD (CANNULA) IMPLANT
VACURETTE 9 RIGID CVD (CANNULA) IMPLANT

## 2014-10-20 NOTE — Anesthesia Preprocedure Evaluation (Signed)
Anesthesia Evaluation  Patient identified by MRN, date of birth, ID band Patient awake    Reviewed: Allergy & Precautions, H&P , Patient's Chart, lab work & pertinent test results, reviewed documented beta blocker date and time   Airway Mallampati: II  TM Distance: >3 FB Neck ROM: full    Dental no notable dental hx.    Pulmonary former smoker,  breath sounds clear to auscultation  Pulmonary exam normal       Cardiovascular Rhythm:regular Rate:Normal     Neuro/Psych    GI/Hepatic   Endo/Other    Renal/GU      Musculoskeletal   Abdominal   Peds  Hematology   Anesthesia Other Findings   Reproductive/Obstetrics                            Anesthesia Physical Anesthesia Plan  ASA: II  Anesthesia Plan:    Post-op Pain Management:    Induction: Intravenous  Airway Management Planned: LMA  Additional Equipment:   Intra-op Plan:   Post-operative Plan:   Informed Consent: I have reviewed the patients History and Physical, chart, labs and discussed the procedure including the risks, benefits and alternatives for the proposed anesthesia with the patient or authorized representative who has indicated his/her understanding and acceptance.   Dental Advisory Given and Dental advisory given  Plan Discussed with: CRNA and Surgeon  Anesthesia Plan Comments: (Discussed GA with LMA, possible sore throat, potential need to switch to ETT, N/V, pulmonary aspiration. Questions answered. )        Anesthesia Quick Evaluation  

## 2014-10-20 NOTE — Discharge Instructions (Signed)
Pelvic rest x 2 weeks (no intercourse or tampons).  No tub baths or swimming for two weeks.    Do not drive until you are not taking narcotic pain medication.   Call your doctor if you have heavy vaginal bleeding (soaking through a pad an hour or more for >2 hours in a row), temperature >101F, severe nausea, vomiting, severe or worsening abdominal pain, dizziness, shortness of breath, chest pain or any other concerns.  Please take motrin every 6 hours.  Add percocet if your pain is not controlled by motrin alone. You have been prescribed an antibiotic.  Please take one tab twice daily for 3 days.

## 2014-10-20 NOTE — Transfer of Care (Signed)
Immediate Anesthesia Transfer of Care Note  Patient: Peggy Nguyen  Procedure(s) Performed: Procedure(s): DILATATION AND EVACUATION (N/A)  Patient Location: PACU  Anesthesia Type:General  Level of Consciousness: awake, alert , oriented and patient cooperative  Airway & Oxygen Therapy: Patient Spontanous Breathing and Patient connected to nasal cannula oxygen  Post-op Assessment: Report given to RN and Post -op Vital signs reviewed and stable  Post vital signs: Reviewed and stable  Last Vitals:  Filed Vitals:   10/20/14 1224  BP: 126/78  Temp: 37 C  Resp: 18    Complications: No apparent anesthesia complications

## 2014-10-20 NOTE — Brief Op Note (Signed)
10/20/2014  1:37 PM  PATIENT:  Peggy Nguyen  34 y.o. female  PRE-OPERATIVE DIAGNOSIS:  MAB  POST-OPERATIVE DIAGNOSIS:  Missed Abortion 10 weeks  PROCEDURE:  Procedure(s): DILATATION AND EVACUATION (N/A)  SURGEON:  Surgeon(s) and Role:    * Marlow Baarsyanna Shenee Wignall, MD - Primary  ANESTHESIA:   general  EBL:  Total I/O In: 1300 [I.V.:1300] Out: 50 [Urine:25; Blood:25]  BLOOD ADMINISTERED:none  DRAINS: none   LOCAL MEDICATIONS USED:  LIDOCAINE, 10 mL  SPECIMEN:  Source of Specimen:  products of conception  DISPOSITION OF SPECIMEN:  PATHOLOGY  COUNTS:  YES  TOURNIQUET:  * No tourniquets in log *  DICTATION: .Note written in EPIC  PLAN OF CARE: Discharge to home after PACU  PATIENT DISPOSITION:  PACU - hemodynamically stable.   Delay start of Pharmacological VTE agent (>24hrs) due to surgical blood loss or risk of bleeding: not applicable

## 2014-10-20 NOTE — Op Note (Signed)
Pre-Operative Diagnosis: missed abortion at 10 weeks Postoperative Diagnosis: same Procedure: Dilation and curettage Surgeon: Marlow Baarsyanna Metta Koranda, MD  Operative Findings: anteverted uterus, 10 week size Specimen products of conception EBL: 25cc    After adequate anesthesia was achieved, the patient placed in the dorsal lithotomy position in [place] stirrups. A bimanual exam revealed a 10 week size anteverted uterus.  She was prepped and draped in the usual sterile fashion.  The weighted speculum was placed in the vagina and the anterior lip of the cervix grasped with a single-tooth tenaculum.   10cc of 1% lidocaine was administered in a paracervical fashion. The cervix was serially dilated with Hank dilators.  A 10 mm suction curette was advanced to the fundus, the vacuum was engaged, and multiple suction passes were performed until the products of conception were evacuated. A Sharp curettage was performed and a gritty texture was noted. A final suction pass was performed with minimal results. This completed the procedure. A bimanual massage was performed and confirmed good uterine tone.   All instruments were removed from the vagina.  Pressure was used to achieve hemostasis at the tenaculum site. The patient tolerated the procedure well was brought to the recovery room in stable condition for the procedure. All sponge and needle counts correct x2.  A portion of the products of conception were sent for chromosomal analysis.  The remainder were sent for routine pathology   Marlow BaarsCLARK, Talaya Lamprecht

## 2014-10-20 NOTE — Anesthesia Procedure Notes (Signed)
Procedure Name: LMA Insertion Date/Time: 10/20/2014 1:12 PM Performed by: Earmon PhoenixWILKERSON, Orlene Salmons P Pre-anesthesia Checklist: Patient identified, Patient being monitored, Timeout performed, Emergency Drugs available and Suction available Patient Re-evaluated:Patient Re-evaluated prior to inductionOxygen Delivery Method: Circle system utilized Preoxygenation: Pre-oxygenation with 100% oxygen Intubation Type: IV induction Ventilation: Mask ventilation without difficulty LMA: LMA inserted LMA Size: 4.0 Placement Confirmation: positive ETCO2,  CO2 detector and breath sounds checked- equal and bilateral

## 2014-10-20 NOTE — Anesthesia Postprocedure Evaluation (Signed)
Anesthesia Post Note  Patient: Peggy Nguyen  Procedure(s) Performed: Procedure(s) (LRB): DILATATION AND EVACUATION (N/A)  Anesthesia type: General  Patient location: PACU  Post pain: Pain level controlled  Post assessment: Post-op Vital signs reviewed  Last Vitals:  Filed Vitals:   10/20/14 1439  BP: 94/59  Pulse: 80  Temp: 37 C  Resp: 17    Post vital signs: Reviewed  Level of consciousness: sedated  Complications: No apparent anesthesia complications

## 2014-10-21 ENCOUNTER — Encounter (HOSPITAL_COMMUNITY): Payer: Self-pay | Admitting: Obstetrics

## 2014-11-10 LAB — CHROMOSOME STD, POC(TISSUE)-NCBH

## 2014-11-30 ENCOUNTER — Other Ambulatory Visit (HOSPITAL_COMMUNITY): Payer: Self-pay | Admitting: Obstetrics

## 2014-11-30 DIAGNOSIS — N96 Recurrent pregnancy loss: Secondary | ICD-10-CM

## 2014-12-08 ENCOUNTER — Ambulatory Visit (HOSPITAL_COMMUNITY)
Admission: RE | Admit: 2014-12-08 | Discharge: 2014-12-08 | Disposition: A | Discharge status: Home / Self Care | Payer: Medicaid Other | Source: Ambulatory Visit | Attending: Obstetrics | Admitting: Obstetrics

## 2015-04-05 ENCOUNTER — Emergency Department (HOSPITAL_COMMUNITY)
Admission: EM | Admit: 2015-04-05 | Discharge: 2015-04-05 | Disposition: A | Discharge status: Home / Self Care | Payer: Worker's Compensation | Attending: Emergency Medicine | Admitting: Emergency Medicine

## 2015-04-05 ENCOUNTER — Encounter (HOSPITAL_COMMUNITY): Payer: Self-pay | Admitting: Emergency Medicine

## 2015-04-05 ENCOUNTER — Emergency Department (HOSPITAL_COMMUNITY): Payer: Worker's Compensation

## 2015-04-05 DIAGNOSIS — S0093XA Contusion of unspecified part of head, initial encounter: Secondary | ICD-10-CM

## 2015-04-05 DIAGNOSIS — Y9289 Other specified places as the place of occurrence of the external cause: Secondary | ICD-10-CM | POA: Diagnosis not present

## 2015-04-05 DIAGNOSIS — Z87891 Personal history of nicotine dependence: Secondary | ICD-10-CM | POA: Diagnosis not present

## 2015-04-05 DIAGNOSIS — W228XXA Striking against or struck by other objects, initial encounter: Secondary | ICD-10-CM | POA: Insufficient documentation

## 2015-04-05 DIAGNOSIS — S199XXA Unspecified injury of neck, initial encounter: Secondary | ICD-10-CM | POA: Insufficient documentation

## 2015-04-05 DIAGNOSIS — Y9389 Activity, other specified: Secondary | ICD-10-CM | POA: Diagnosis not present

## 2015-04-05 DIAGNOSIS — J45909 Unspecified asthma, uncomplicated: Secondary | ICD-10-CM | POA: Insufficient documentation

## 2015-04-05 DIAGNOSIS — Y998 Other external cause status: Secondary | ICD-10-CM | POA: Insufficient documentation

## 2015-04-05 DIAGNOSIS — S0990XA Unspecified injury of head, initial encounter: Secondary | ICD-10-CM | POA: Diagnosis present

## 2015-04-05 DIAGNOSIS — S0083XA Contusion of other part of head, initial encounter: Secondary | ICD-10-CM | POA: Insufficient documentation

## 2015-04-05 MED ORDER — IBUPROFEN 800 MG PO TABS
800.0000 mg | ORAL_TABLET | Freq: Once | ORAL | Status: AC
  Administered 2015-04-05: 800 mg via ORAL
  Filled 2015-04-05: qty 1

## 2015-04-05 MED ORDER — IBUPROFEN 800 MG PO TABS: 800.0000 mg | ORAL_TABLET | Freq: Three times a day (TID) | ORAL | Status: DC | PRN

## 2015-04-05 NOTE — Discharge Instructions (Signed)
Follow up with your md if needed °
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
## 2015-05-29 ENCOUNTER — Encounter (HOSPITAL_COMMUNITY): Payer: Self-pay | Admitting: Oncology

## 2015-05-29 ENCOUNTER — Emergency Department (HOSPITAL_COMMUNITY)
Admission: EM | Admit: 2015-05-29 | Discharge: 2015-05-29 | Disposition: A | Discharge status: Home / Self Care | Payer: Medicaid Other | Attending: Emergency Medicine | Admitting: Emergency Medicine

## 2015-05-29 DIAGNOSIS — R05 Cough: Secondary | ICD-10-CM

## 2015-05-29 DIAGNOSIS — J069 Acute upper respiratory infection, unspecified: Secondary | ICD-10-CM

## 2015-05-29 DIAGNOSIS — R059 Cough, unspecified: Secondary | ICD-10-CM

## 2015-05-29 DIAGNOSIS — Z87891 Personal history of nicotine dependence: Secondary | ICD-10-CM | POA: Diagnosis not present

## 2015-05-29 DIAGNOSIS — J45901 Unspecified asthma with (acute) exacerbation: Secondary | ICD-10-CM | POA: Diagnosis not present

## 2015-05-29 DIAGNOSIS — R0602 Shortness of breath: Secondary | ICD-10-CM

## 2015-05-29 MED ORDER — ALBUTEROL SULFATE HFA 108 (90 BASE) MCG/ACT IN AERS: 2.0000 | INHALATION_SPRAY | RESPIRATORY_TRACT | Status: DC | PRN

## 2015-05-29 NOTE — Discharge Instructions (Signed)
Continue to stay well-hydrated. Gargle warm salt water and spit it out. Continue to alternate between Tylenol and Ibuprofen for pain or fever. Use Mucinex for cough suppression/expectoration of mucus. Use netipot and flonase to help with nasal congestion. May consider over-the-counter Benadryl or other antihistamine to decrease secretions and for watery itchy eyes. Use inhaler as directed, as needed for cough/chest congestion. Followup with your primary care doctor in 5-7 days for recheck of ongoing symptoms. Return to emergency department for emergent changing or worsening of symptoms.   Cough, Adult A cough helps to clear your throat and lungs. A cough may last only 2-3 weeks (acute), or it may last longer than 8 weeks (chronic). Many different things can cause a cough. A cough may be a sign of an illness or another medical condition. HOME CARE  Pay attention to any changes in your cough.  Take medicines only as told by your doctor.  If you were prescribed an antibiotic medicine, take it as told by your doctor. Do not stop taking it even if you start to feel better.  Talk with your doctor before you try using a cough medicine.  Drink enough fluid to keep your pee (urine) clear or pale yellow.  If the air is dry, use a cold steam vaporizer or humidifier in your home.  Stay away from things that make you cough at work or at home.  If your cough is worse at night, try using extra pillows to raise your head up higher while you sleep.  Do not smoke, and try not to be around smoke. If you need help quitting, ask your doctor.  Do not have caffeine.  Do not drink alcohol.  Rest as needed. GET HELP IF:  You have new problems (symptoms).  You cough up yellow fluid (pus).  Your cough does not get better after 2-3 weeks, or your cough gets worse.  Medicine does not help your cough and you are not sleeping well.  You have pain that gets worse or pain that is not helped with  medicine.  You have a fever.  You are losing weight and you do not know why.  You have night sweats. GET HELP RIGHT AWAY IF:  You cough up blood.  You have trouble breathing.  Your heartbeat is very fast.   This information is not intended to replace advice given to you by your health care provider. Make sure you discuss any questions you have with your health care provider.   Document Released: 02/02/2011 Document Revised: 02/10/2015 Document Reviewed: 07/29/2014 Elsevier Interactive Patient Education 2016 [employer]  Shortness of Breath Shortness of breath means you have trouble breathing. Shortness of breath needs medical care right away. HOME CARE   Do not smoke.  Avoid being around chemicals or things (paint fumes, dust) that may bother your breathing.  Rest as needed. Slowly begin your normal activities.  Only take medicines as told by your doctor.  Keep all doctor visits as told. GET HELP RIGHT AWAY IF:   Your shortness of breath gets worse.  You feel lightheaded, pass out (faint), or have a cough that is not helped by medicine.  You cough up blood.  You have pain with breathing.  You have pain in your chest, arms, shoulders, or belly (abdomen).  You have a fever.  You cannot walk up stairs or exercise the way you normally do.  You do not get better in the time expected.  You have a hard time doing  normal activities even with rest.  You have problems with your medicines.  You have any new symptoms. MAKE SURE YOU:  Understand these instructions.  Will watch your condition.  Will get help right away if you are not doing well or get worse.   This information is not intended to replace advice given to you by your health care provider. Make sure you discuss any questions you have with your health care provider.   Document Released: 11/08/2007 Document Revised: 05/27/2013 Document Reviewed: 08/07/2011 Elsevier Interactive Patient Education 2016  [employer] Inc[employer]  Upper Respiratory Infection, Adult Most upper respiratory infections (URIs) are caused by a virus. A URI affects the nose, throat, and upper air passages. The most common type of URI is often called "the common cold." HOME CARE   Take medicines only as told by your doctor.  Gargle warm saltwater or take cough drops to comfort your throat as told by your doctor.  Use a warm mist humidifier or inhale steam from a shower to increase air moisture. This may make it easier to breathe.  Drink enough fluid to keep your pee (urine) clear or pale yellow.  Eat soups and other clear broths.  Have a healthy diet.  Rest as needed.  Go back to work when your fever is gone or your doctor says it is okay.  You may need to stay home longer to avoid giving your URI to others.  You can also wear a face mask and wash your hands often to prevent spread of the virus.  Use your inhaler more if you have asthma.  Do not use any tobacco products, including cigarettes, chewing tobacco, or electronic cigarettes. If you need help quitting, ask your doctor. GET HELP IF:  You are getting worse, not better.  Your symptoms are not helped by medicine.  You have chills.  You are getting more short of breath.  You have brown or red mucus.  You have yellow or brown discharge from your nose.  You have pain in your face, especially when you bend forward.  You have a fever.  You have puffy (swollen) neck glands.  You have pain while swallowing.  You have white areas in the back of your throat. GET HELP RIGHT AWAY IF:   You have very bad or constant:  Headache.  Ear pain.  Pain in your forehead, behind your eyes, and over your cheekbones (sinus pain).  Chest pain.  You have long-lasting (chronic) lung disease and any of the following:  Wheezing.  Long-lasting cough.  Coughing up blood.  A change in your usual mucus.  You have a stiff neck.  You have changes in  your:  Vision.  Hearing.  Thinking.  Mood. MAKE SURE YOU:   Understand these instructions.  Will watch your condition.  Will get help right away if you are not doing well or get worse.   This information is not intended to replace advice given to you by your health care provider. Make sure you discuss any questions you have with your health care provider.   Document Released: 11/08/2007 Document Revised: 10/06/2014 Document Reviewed: 08/27/2013 Elsevier Interactive Patient Education [employer]

## 2015-05-29 NOTE — ED Provider Notes (Signed)
CSN: [id number]     Arrival date & time 05/29/15  0212 History   First MD Initiated Contact with Patient 05/29/15 0226     Chief Complaint  Patient presents with  . URI     (Consider location/radiation/quality/duration/timing/severity/associated sxs/prior Treatment) HPI Comments: Peggy Nguyen is a 34 y.o. female with a PMHx of asthma, who presents to the ED with complaints of URI symptoms 1 week. Patient states she has had a dry cough, sinus congestion, mild sore throat, wheezing, and shortness of breath that are worse at night when she tries to lay down and she has not tried anything prior to arrival for treatment. Patient states she has had positive sick contacts at work. She denies rhinorrhea, drooling, trismus, ear pain/drainage, eye itching/drainage, fevers, chills, CP, LE swelling, hemoptysis, recent travel/surgery/immobilization, OCP use, hx of PE, abd pain, N/V/D/C, hematuria, dysuria, myalgias, arthralgias, numbness, tingling, weakness, or rashes.   Patient is a 34 y.o. female presenting with URI. The history is provided by the patient. No language interpreter was used.  URI Presenting symptoms: congestion, cough (dry) and sore throat (mild)   Presenting symptoms: no ear pain, no fever and no rhinorrhea   Severity:  Moderate Onset quality:  Gradual Duration:  1 week Timing:  Constant Progression:  Unchanged Chronicity:  New Relieved by:  None tried Exacerbated by: at night when laying down. Ineffective treatments:  None tried Associated symptoms: wheezing   Associated symptoms: no arthralgias and no myalgias   Risk factors: sick contacts   Risk factors: no recent travel     Past Medical History  Diagnosis Date  . Asthma     childhood   Past Surgical History  Procedure Laterality Date  . Dilation and evacuation N/A 01/09/2014    Procedure: DILATATION AND EVACUATION;  Surgeon: Freddrick MarchKendra H. Tenny Crawoss, MD;  Location: [facility];  Service: Gynecology;  Laterality: N/A;  . Dilation  and evacuation N/A 10/20/2014    Procedure: DILATATION AND EVACUATION;  Surgeon: Marlow Baarsyanna Clark, MD;  Location: [facility];  Service: Gynecology;  Laterality: N/A;   Family History  Problem Relation Age of Onset  . Asthma Mother   . Lupus Sister   . Cancer Maternal Grandfather     prostate  . Diabetes Paternal Grandfather   . Hearing loss Neg Hx    Social History  Substance Use Topics  . Smoking status: Former Smoker -- 0.50 packs/day for 5 years    Types: Cigarettes    Quit date: 11/03/2013  . Smokeless tobacco: Never Used  . Alcohol Use: No   OB History    Gravida Para Term Preterm AB TAB SAB Ectopic Multiple Living   2    1  1         Review of Systems  Constitutional: Negative for fever and chills.  HENT: Positive for congestion, sinus pressure and sore throat (mild). Negative for drooling, ear discharge, ear pain, rhinorrhea and trouble swallowing.   Eyes: Negative for discharge and itching.  Respiratory: Positive for cough (dry), shortness of breath and wheezing.   Cardiovascular: Negative for chest pain and leg swelling.  Gastrointestinal: Negative for nausea, vomiting, abdominal pain, diarrhea and constipation.  Genitourinary: Negative for dysuria and hematuria.  Musculoskeletal: Negative for myalgias and arthralgias.  Skin: Negative for rash.  Allergic/Immunologic: Negative for immunocompromised state.  Neurological: Negative for weakness and numbness.  Psychiatric/Behavioral: Negative for confusion.   10 Systems reviewed and are negative for acute change except as noted in the HPI.    Allergies  Shellfish allergy  Home Medications   Prior to Admission medications   Medication Sig Start Date End Date Taking? Authorizing Provider  doxycycline (VIBRAMYCIN) 100 MG capsule Take 1 capsule (100 mg total) by mouth 2 (two) times daily. Patient not taking: Reported on 04/05/2015 10/20/14   Marlow Baars, MD  ibuprofen (ADVIL,MOTRIN) 800 MG tablet Take 1 tablet (800 mg total)  by mouth every 8 (eight) hours as needed for moderate pain. 04/05/15   Bethann Berkshire, MD  oxyCODONE-acetaminophen (PERCOCET/ROXICET) 5-325 MG per tablet Take 1 tablet by mouth every 6 (six) hours as needed for severe pain. Patient not taking: Reported on 04/05/2015 10/20/14   Marlow Baars, MD   BP 130/80 mmHg  Pulse 99  Temp(Src) 98.6 F (37 C) (Oral)  Resp 20  Ht  (1.549 m)  Wt 74.844 kg  BMI 31.19 kg/m2  SpO2 100%  LMP 04/30/2015 (Exact Date) Physical Exam  Constitutional: She is oriented to person, place, and time. Vital signs are normal. She appears well-developed and well-nourished.  Non-toxic appearance. No distress.  Afebrile, nontoxic, NAD  HENT:  Head: Normocephalic and atraumatic.  Nose: Mucosal edema present. No rhinorrhea.  Mouth/Throat: Uvula is midline, oropharynx is clear and moist and mucous membranes are normal. No trismus in the jaw. No uvula swelling.  Nose with mild mucosal edema without rhinorrhea. Oropharynx clear and moist, without uvular swelling or deviation, no trismus or drooling, no tonsillar swelling or erythema, no exudates.    Eyes: Conjunctivae and EOM are normal. Right eye exhibits no discharge. Left eye exhibits no discharge.  Neck: Normal range of motion. Neck supple.  Cardiovascular: Normal rate, regular rhythm, normal heart sounds and intact distal pulses.  Exam reveals no gallop and no friction rub.   No murmur heard. Pulmonary/Chest: Effort normal and breath sounds normal. No respiratory distress. She has no decreased breath sounds. She has no wheezes. She has no rhonchi. She has no rales.  CTAB in all lung fields, no w/r/r, no hypoxia or increased WOB, speaking in full sentences, SpO2 100% on RA   Abdominal: Soft. Normal appearance and bowel sounds are normal. She exhibits no distension. There is no tenderness. There is no rigidity, no rebound and no guarding.  Musculoskeletal: Normal range of motion.  MAE x4 Strength and sensation grossly  intact Distal pulses intact No pedal edema, neg homan's bilaterally   Lymphadenopathy:    She has cervical adenopathy.  Shotty anterior cervical LAD bilaterally which is nonTTP  Neurological: She is alert and oriented to person, place, and time. She has normal strength. No sensory deficit.  Skin: Skin is warm, dry and intact. No rash noted.  Psychiatric: She has a normal mood and affect.  Nursing note and vitals reviewed.   ED Course  Procedures (including critical care time) Labs Review Labs Reviewed - No data to display  Imaging Review No results found. I have personally reviewed and evaluated these images and lab results as part of my medical decision-making.   EKG Interpretation None      MDM   Final diagnoses:  URI (upper respiratory infection)  Cough  SOB (shortness of breath)    34 y.o. female here with URI symptoms. Pt is afebrile with a clear lung exam, no wheezing. Appears in NAD, no respiratory distress, no tachycardia or hypoxia, doubt PE. Mild sinus congestion. Likely viral URI. Will send home with rx for inhaler since pt doesn't have one at home, but doubt need for CXR or nebs/prednisone here. Pt is  agreeable to symptomatic treatment with close follow up with PCP as needed but spoke at length about emergent changing or worsening of symptoms that should prompt return to ER. Pt voices understanding and is agreeable to plan. Stable at time of discharge.   BP 130/80 mmHg  Pulse 99  Temp(Src) 98.6 F (37 C) (Oral)  Resp 20  Ht  (1.549 m)  Wt 74.844 kg  BMI 31.19 kg/m2  SpO2 100%  LMP 04/30/2015 (Exact Date)  Meds ordered this encounter  Medications  . albuterol (PROVENTIL HFA;VENTOLIN HFA) 108 (90 BASE) MCG/ACT inhaler    Sig: Inhale 2 puffs into the lungs every 4 (four) hours as needed for wheezing or shortness of breath (cough).    Dispense:  1 Inhaler    Refill:  0    Order Specific Question:  Supervising Provider    Answer:  Eber Hong [[id number]]      Jeneane Pieczynski Camprubi-Soms, PA-C 05/29/15 0241  Dione Booze, MD 05/29/15 [phone]

## 2015-05-29 NOTE — ED Notes (Signed)
Pt has had cough, congestion, sore throat, runny nose, SOB and wheezing while supine x 1 week.  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  Denies pain or fever.

## 2015-07-06 ENCOUNTER — Encounter (HOSPITAL_COMMUNITY): Payer: Self-pay

## 2015-07-07 ENCOUNTER — Other Ambulatory Visit: Payer: Self-pay | Admitting: Obstetrics and Gynecology

## 2015-07-07 NOTE — H&P (Signed)
Peggy Nguyen is a 34 y.o. female presenting for Suction dilation and evacuation for missed abortion  34 yo G3P0020 presents for suction D&E for missed abortion. 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 After her last miscarriage the patient had a workup for recurrent ab with antiphospholipid antibodies, lupus anticoagulant, and karyoptype of POCs and these were all negative.  History OB History    Gravida Para Term Preterm AB TAB SAB Ectopic Multiple Living   2    1  1        Past Medical History  Diagnosis Date  . Asthma     childhood   Past Surgical History  Procedure Laterality Date  . Dilation and evacuation N/A 01/09/2014    Procedure: DILATATION AND EVACUATION;  Surgeon: Rima Blizzard H. Mark Benecke, MD;  Location: [facility];  Service: Gynecology;  Laterality: N/A;  . Dilation and evacuation N/A 10/20/2014    Procedure: DILATATION AND EVACUATION;  Surgeon: Dyanna Clark, MD;  Location: [facility];  Service: Gynecology;  Laterality: N/A;   Family History: family history includes Asthma in her mother; Cancer in her maternal grandfather; Diabetes in her paternal grandfather; Lupus in her sister. There is no history of Hearing loss. Social History:  reports that she quit smoking about 20 months ago. Her smoking use included Cigarettes. She has a 2.5 pack-year smoking history. She has never used smokeless tobacco. She reports that she does not drink alcohol or use illicit drugs.   ROS    Last menstrual period 05/01/2015, unknown if currently breastfeeding. Exam Physical Exam  Prenatal labs: ABO, Rh: --/--/O POS, O POS (05/17 1140) Antibody: NEG (05/17 1140)    Assessment/Plan: 1) Admit 2) Suction D&E for missed abortion 3) consider microarray testing of POCs for possible cause of recurrent ab   [name] H. 07/07/2015, 10:35 AM     

## 2015-07-09 ENCOUNTER — Encounter (HOSPITAL_COMMUNITY): Payer: Self-pay | Admitting: *Deleted

## 2015-07-09 ENCOUNTER — Ambulatory Visit (HOSPITAL_COMMUNITY): Payer: Medicaid Other | Admitting: Anesthesiology

## 2015-07-09 ENCOUNTER — Ambulatory Visit (HOSPITAL_COMMUNITY)
Admission: RE | Admit: 2015-07-09 | Discharge: 2015-07-09 | Disposition: A | Discharge status: Home / Self Care | Payer: Medicaid Other | Source: Ambulatory Visit | Attending: Obstetrics and Gynecology | Admitting: Obstetrics and Gynecology

## 2015-07-09 ENCOUNTER — Encounter (HOSPITAL_COMMUNITY)
Admission: RE | Disposition: A | Discharge status: Home / Self Care | Payer: Self-pay | Source: Ambulatory Visit | Attending: Obstetrics and Gynecology

## 2015-07-09 DIAGNOSIS — Z87891 Personal history of nicotine dependence: Secondary | ICD-10-CM | POA: Insufficient documentation

## 2015-07-09 DIAGNOSIS — Z3A09 9 weeks gestation of pregnancy: Secondary | ICD-10-CM | POA: Diagnosis not present

## 2015-07-09 DIAGNOSIS — O021 Missed abortion: Secondary | ICD-10-CM | POA: Diagnosis present

## 2015-07-09 HISTORY — PX: DILATION AND EVACUATION: SHX1459

## 2015-07-09 LAB — CBC
HCT: 34.3 % — ABNORMAL LOW (ref 36.0–46.0)
Hemoglobin: 11.6 g/dL — ABNORMAL LOW (ref 12.0–15.0)
MCH: 27.5 pg (ref 26.0–34.0)
MCHC: 33.8 g/dL (ref 30.0–36.0)
MCV: 81.3 fL (ref 78.0–100.0)
Platelets: 330 10*3/uL (ref 150–400)
RBC: 4.22 MIL/uL (ref 3.87–5.11)
RDW: 13.5 % (ref 11.5–15.5)
WBC: 3.8 10*3/uL — ABNORMAL LOW (ref 4.0–10.5)

## 2015-07-09 SURGERY — DILATION AND EVACUATION, UTERUS
Anesthesia: General

## 2015-07-09 MED ORDER — SCOPOLAMINE 1 MG/3DAYS TD PT72
1.0000 | MEDICATED_PATCH | Freq: Once | TRANSDERMAL | Status: DC
  Administered 2015-07-09: 1.5 mg via TRANSDERMAL

## 2015-07-09 MED ORDER — LACTATED RINGERS IV SOLN
INTRAVENOUS | Status: DC
  Administered 2015-07-09: 08:00:00 via INTRAVENOUS

## 2015-07-09 MED ORDER — FENTANYL CITRATE (PF) 100 MCG/2ML IJ SOLN
INTRAMUSCULAR | Status: DC | PRN
Start: 2015-07-09 — End: 2015-07-09
  Administered 2015-07-09 (×2): 50 ug via INTRAVENOUS

## 2015-07-09 MED ORDER — DEXAMETHASONE SODIUM PHOSPHATE 10 MG/ML IJ SOLN
INTRAMUSCULAR | Status: DC | PRN
  Administered 2015-07-09: 4 mg via INTRAVENOUS

## 2015-07-09 MED ORDER — LIDOCAINE HCL (CARDIAC) 20 MG/ML IV SOLN
INTRAVENOUS | Status: DC | PRN
  Administered 2015-07-09: 50 mg via INTRAVENOUS

## 2015-07-09 MED ORDER — FENTANYL CITRATE (PF) 100 MCG/2ML IJ SOLN: 25.0000 ug | INTRAMUSCULAR | Status: DC | PRN

## 2015-07-09 MED ORDER — OXYCODONE-ACETAMINOPHEN 5-325 MG PO TABS
ORAL_TABLET | ORAL | Status: AC
  Administered 2015-07-09: 1 via ORAL
  Filled 2015-07-09: qty 1

## 2015-07-09 MED ORDER — KETOROLAC TROMETHAMINE 30 MG/ML IJ SOLN
INTRAMUSCULAR | Status: DC | PRN
  Administered 2015-07-09: 30 mg via INTRAVENOUS

## 2015-07-09 MED ORDER — MIDAZOLAM HCL 2 MG/2ML IJ SOLN
INTRAMUSCULAR | Status: AC
  Filled 2015-07-09: qty 2

## 2015-07-09 MED ORDER — LIDOCAINE HCL (CARDIAC) 20 MG/ML IV SOLN
INTRAVENOUS | Status: AC
  Filled 2015-07-09: qty 5

## 2015-07-09 MED ORDER — LIDOCAINE HCL 1 % IJ SOLN
INTRAMUSCULAR | Status: AC
Start: 2015-07-09 — End: 2015-07-09
  Filled 2015-07-09: qty 20

## 2015-07-09 MED ORDER — LACTATED RINGERS IV SOLN: INTRAVENOUS | Status: DC

## 2015-07-09 MED ORDER — LACTATED RINGERS IV SOLN
INTRAVENOUS | Status: DC
Start: 2015-07-09 — End: 2015-07-09

## 2015-07-09 MED ORDER — DEXAMETHASONE SODIUM PHOSPHATE 4 MG/ML IJ SOLN
INTRAMUSCULAR | Status: AC
  Filled 2015-07-09: qty 1

## 2015-07-09 MED ORDER — PROPOFOL 10 MG/ML IV BOLUS
INTRAVENOUS | Status: DC | PRN
  Administered 2015-07-09: 200 mg via INTRAVENOUS

## 2015-07-09 MED ORDER — OXYCODONE-ACETAMINOPHEN 5-325 MG PO TABS: 2.0000 | ORAL_TABLET | ORAL | Status: DC | PRN

## 2015-07-09 MED ORDER — OXYCODONE-ACETAMINOPHEN 5-325 MG PO TABS
1.0000 | ORAL_TABLET | ORAL | Status: DC | PRN
  Administered 2015-07-09: 1 via ORAL

## 2015-07-09 MED ORDER — ONDANSETRON HCL 4 MG/2ML IJ SOLN
INTRAMUSCULAR | Status: AC
  Filled 2015-07-09: qty 2

## 2015-07-09 MED ORDER — IBUPROFEN 600 MG PO TABS: 600.0000 mg | ORAL_TABLET | Freq: Four times a day (QID) | ORAL | Status: DC | PRN

## 2015-07-09 MED ORDER — SCOPOLAMINE 1 MG/3DAYS TD PT72
MEDICATED_PATCH | TRANSDERMAL | Status: AC
  Administered 2015-07-09: 1.5 mg via TRANSDERMAL
  Filled 2015-07-09: qty 1

## 2015-07-09 MED ORDER — LIDOCAINE HCL 1 % IJ SOLN
INTRAMUSCULAR | Status: DC | PRN
  Administered 2015-07-09: 10 mL

## 2015-07-09 MED ORDER — PROPOFOL 10 MG/ML IV BOLUS
INTRAVENOUS | Status: AC
Start: 2015-07-09 — End: 2015-07-09
  Filled 2015-07-09: qty 20

## 2015-07-09 MED ORDER — FENTANYL CITRATE (PF) 100 MCG/2ML IJ SOLN
INTRAMUSCULAR | Status: AC
  Filled 2015-07-09: qty 2

## 2015-07-09 MED ORDER — MIDAZOLAM HCL 2 MG/2ML IJ SOLN
INTRAMUSCULAR | Status: DC | PRN
  Administered 2015-07-09: 2 mg via INTRAVENOUS

## 2015-07-09 MED ORDER — ONDANSETRON HCL 4 MG/2ML IJ SOLN
INTRAMUSCULAR | Status: DC | PRN
  Administered 2015-07-09: 4 mg via INTRAVENOUS

## 2015-07-09 SURGICAL SUPPLY — 19 items
CATH ROBINSON RED A/P 16FR (CATHETERS) ×3 IMPLANT
CLOTH BEACON ORANGE TIMEOUT ST (SAFETY) ×3 IMPLANT
DECANTER SPIKE VIAL GLASS SM (MISCELLANEOUS) ×3 IMPLANT
DILATOR CANAL MILEX (MISCELLANEOUS) IMPLANT
GLOVE BIO SURGEON STRL SZ7 (GLOVE) ×3 IMPLANT
GLOVE BIOGEL PI IND STRL 7.0 (GLOVE) ×1 IMPLANT
GLOVE BIOGEL PI INDICATOR 7.0 (GLOVE) ×2
GOWN STRL REUS W/TWL LRG LVL3 (GOWN DISPOSABLE) ×6 IMPLANT
KIT BERKELEY 1ST TRIMESTER 3/8 (MISCELLANEOUS) ×3 IMPLANT
NS IRRIG 1000ML POUR BTL (IV SOLUTION) ×3 IMPLANT
PACK VAGINAL MINOR WOMEN LF (CUSTOM PROCEDURE TRAY) ×3 IMPLANT
PAD OB MATERNITY 4.3X12.25 (PERSONAL CARE ITEMS) ×3 IMPLANT
PAD PREP 24X48 CUFFED NSTRL (MISCELLANEOUS) ×3 IMPLANT
SET BERKELEY SUCTION TUBING (SUCTIONS) ×3 IMPLANT
TOWEL OR 17X24 6PK STRL BLUE (TOWEL DISPOSABLE) ×6 IMPLANT
VACURETTE 10 RIGID CVD (CANNULA) IMPLANT
VACURETTE 7MM CVD STRL WRAP (CANNULA) IMPLANT
VACURETTE 8 RIGID CVD (CANNULA) IMPLANT
VACURETTE 9 RIGID CVD (CANNULA) ×3 IMPLANT

## 2015-07-09 NOTE — Op Note (Signed)
Pre-Operative Diagnosis: 1) 9 week missed abortion Postoperative Diagnosis: 1) 9 Week missed abortion Procedure: Suction Dilation and evacuation Surgeon: Dr. Waynard Reeds Assistant: none Operative Findings: 9 week size uterus Specimen: POCs EBL: minimal  Peggy Nguyen Is a 35 year old gravida 3 para 0020 who presents for definitive surgical management for Missed abortion. Please see the patient's history and physical for complete details of the history. Management options were discussed with the patient. R/B/A reviewed. Following appropriate informed consent was taken to the operating room. The patient was appropriately identified during a time out procedure. General anesthesia with LMA was administered and the patient was placed in the dorsal lithotomy position. The patient was prepped and draped in the normal sterile fashion. A speculum was placed into the vagina, a single-tooth tenaculum was placed on the anterior lip of the cervix, and 10 cc of 1% lidocaine was administered in a paracervical fashion. The cervix was serially dilated with Hank dilators. A #9 suction curet was then passed to the fundus, the vacuum was engaged, and 3 suction passes were performed with a curette. A Sharp curettage was performed and a gritty texture was noted. A final suction pass was performed with minimal results. This completed the procedure. The patient tolerated the procedure well was brought to the recovery room in stable condition for the procedure. All sponge and needle counts correct x2.

## 2015-07-09 NOTE — Interval H&P Note (Signed)
History and Physical Interval Note:  07/09/2015 8:11 AM  Peggy Nguyen  has presented today for surgery, with the diagnosis of MAB  The various methods of treatment have been discussed with the patient and family. After consideration of risks, benefits and other options for treatment, the patient has consented to  Procedure(s): DILATATION AND EVACUATION (N/A) as a surgical intervention .  The patient's history has been reviewed, patient examined, no change in status, stable for surgery.  I have reviewed the patient's chart and labs.  Questions were answered to the patient's satisfaction.     [name]

## 2015-07-09 NOTE — Discharge Instructions (Signed)

## 2015-07-09 NOTE — Transfer of Care (Signed)
Immediate Anesthesia Transfer of Care Note  Patient: Peggy Nguyen  Procedure(s) Performed: Procedure(s): DILATATION AND EVACUATION (N/A)  Patient Location: PACU  Anesthesia Type:General  Level of Consciousness: awake, alert  and oriented  Airway & Oxygen Therapy: Patient Spontanous Breathing and Patient connected to nasal cannula oxygen  Post-op Assessment: Report given to RN and Post -op Vital signs reviewed and stable  Post vital signs: Reviewed and stable  Last Vitals:  Filed Vitals:   07/09/15 0714  BP: 116/79  Pulse: 80  Temp: 36.9 C  Resp: 20    Complications: No apparent anesthesia complications

## 2015-07-09 NOTE — H&P (View-Only) (Signed)
Peggy Nguyen is a 35 y.o. female presenting for Suction dilation and evacuation for missed abortion  [name] presents for suction D&E for missed abortion. 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 After her last miscarriage the patient had a workup for recurrent ab with antiphospholipid antibodies, lupus anticoagulant, and karyoptype of POCs and these were all negative.  History OB History    Gravida Para Term Preterm AB TAB SAB Ectopic Multiple Living   Past Medical History  Diagnosis Date  . Asthma     childhood   Past Surgical History  Procedure Laterality Date  . Dilation and evacuation N/A 01/09/2014    Procedure: DILATATION AND EVACUATION;  Surgeon: Freddrick March. Tenny Craw, MD;  Location: [facility];  Service: Gynecology;  Laterality: N/A;  . Dilation and evacuation N/A 10/20/2014    Procedure: DILATATION AND EVACUATION;  Surgeon: Marlow Baars, MD;  Location: [facility];  Service: Gynecology;  Laterality: N/A;   Family History: family history includes Asthma in her mother; Cancer in her maternal grandfather; Diabetes in her paternal grandfather; Lupus in her sister. There is no history of Hearing loss. Social History:  reports that she quit smoking about 20 months ago. Her smoking use included Cigarettes. She has a 2.5 pack-year smoking history. She has never used smokeless tobacco. She reports that she does not drink alcohol or use illicit drugs.   ROS    Last menstrual period 05/01/2015, unknown if currently breastfeeding. Exam Physical Exam  Prenatal labs: ABO, Rh: --/--/O POS, O POS (05/17 1140) Antibody: NEG (05/17 1140)    Assessment/Plan: 1) Admit 2) Suction D&E for missed abortion 3) consider microarray testing of POCs for possible cause of recurrent ab   Peggy Nguyen H. 07/07/2015, 10:35 AM

## 2015-07-09 NOTE — Anesthesia Preprocedure Evaluation (Addendum)
Anesthesia Evaluation  Patient identified by MRN, date of birth, ID band Patient awake    Reviewed: Allergy & Precautions, H&P , NPO status , Patient's Chart, lab work & pertinent test results  Airway Mallampati: II  TM Distance: >3 FB Neck ROM: full    Dental no notable dental hx. (+) Dental Advisory Given, Teeth Intact   Pulmonary former smoker,    Pulmonary exam normal breath sounds clear to auscultation       Cardiovascular Exercise Tolerance: Good negative cardio ROS Normal cardiovascular exam     Neuro/Psych negative neurological ROS  negative psych ROS   GI/Hepatic negative GI ROS, Neg liver ROS,   Endo/Other  negative endocrine ROS  Renal/GU negative Renal ROS  negative genitourinary   Musculoskeletal   Abdominal Normal abdominal exam  (+)   Peds  Hematology negative hematology ROS (+)   Anesthesia Other Findings   Reproductive/Obstetrics negative OB ROS                           Anesthesia Physical Anesthesia Plan  ASA: II  Anesthesia Plan: General   Post-op Pain Management:    Induction: Intravenous  Airway Management Planned: LMA  Additional Equipment:   Intra-op Plan:   Post-operative Plan:   Informed Consent: I have reviewed the patients History and Physical, chart, labs and discussed the procedure including the risks, benefits and alternatives for the proposed anesthesia with the patient or authorized representative who has indicated his/her understanding and acceptance.     Plan Discussed with: CRNA and Surgeon  Anesthesia Plan Comments:       Anesthesia Quick Evaluation

## 2015-07-11 ENCOUNTER — Encounter (HOSPITAL_COMMUNITY): Payer: Self-pay | Admitting: Obstetrics and Gynecology

## 2015-07-12 NOTE — Anesthesia Postprocedure Evaluation (Signed)
Anesthesia Post Note  Patient: Peggy Nguyen  Procedure(s) Performed: Procedure(s) (LRB): DILATATION AND EVACUATION (N/A)  Patient location during evaluation: PACU Anesthesia Type: General Level of consciousness: awake Pain management: pain level controlled Vital Signs Assessment: post-procedure vital signs reviewed and stable Respiratory status: spontaneous breathing Cardiovascular status: stable Postop Assessment: no signs of nausea or vomiting Anesthetic complications: no    Last Vitals:  Filed Vitals:   07/09/15 0945 07/09/15 1000  BP: 115/53 121/76  Pulse:  90  Temp: 36.8 C 36.8 C  Resp:      Last Pain:  Filed Vitals:   07/09/15 1006  PainSc: 4                  Mathias Bogacki JR,JOHN Katya Rolston

## 2016-03-30 IMAGING — CT CT HEAD W/O CM
3 of 6 series · 15 of 47 positions shown, 18 images · non-contrast
Comparison: None.

CLINICAL DATA: Initial encounter for being struck in head with
metal door at work. Ringing in right ear. Blurry vision.

EXAM:
CT HEAD WITHOUT CONTRAST
CT CERVICAL SPINE WITHOUT CONTRAST
TECHNIQUE: Multidetector CT imaging of the head and cervical spine was
performed following the standard protocol without intravenous
contrast. Multiplanar CT image reconstructions of the cervical spine
were also generated.

[Series 7: axial recon · axial · 0.23mm/px · z∈[-216,-78]mm · 9 of 90 slices shown, 12 images]
[im 9/90  brain]
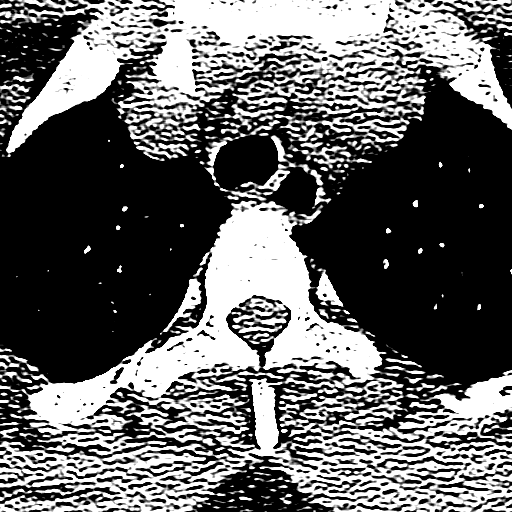
[im 9/90  bone]
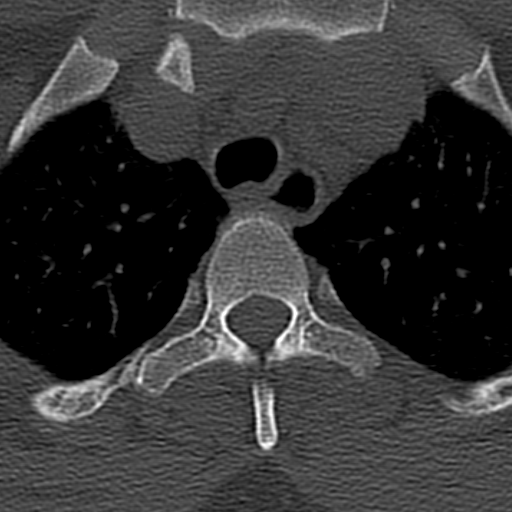
[im 18/90  brain]
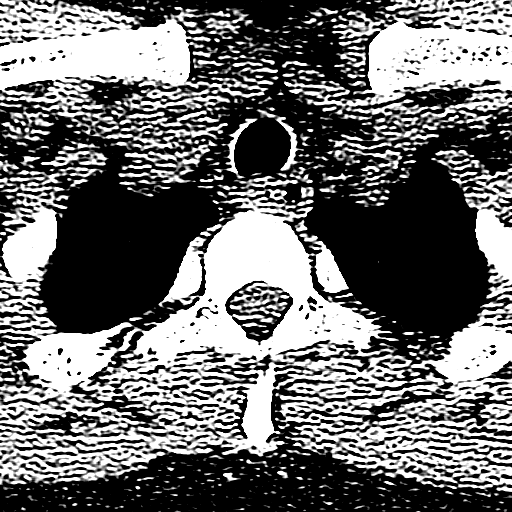
[im 27/90  brain]
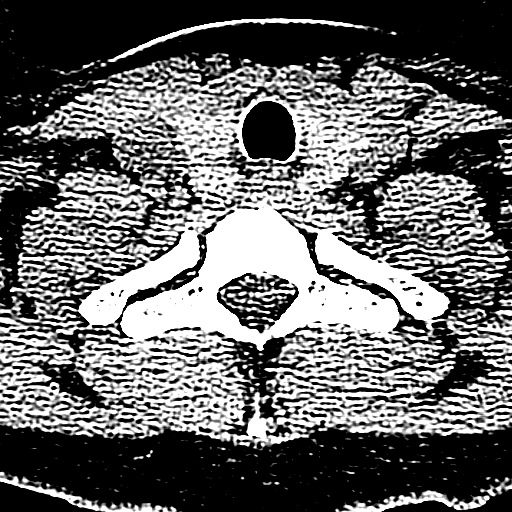
[im 36/90  brain]
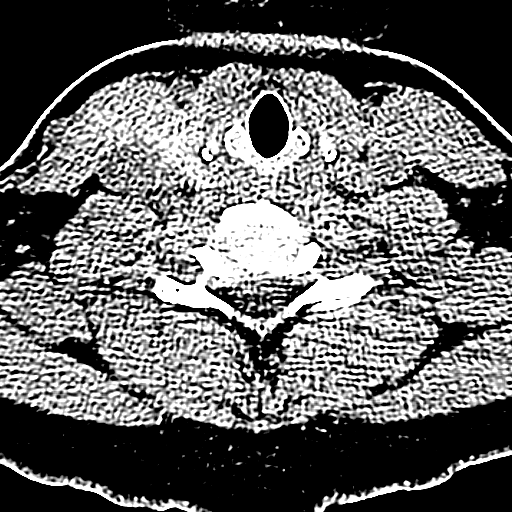
[im 45/90  brain]
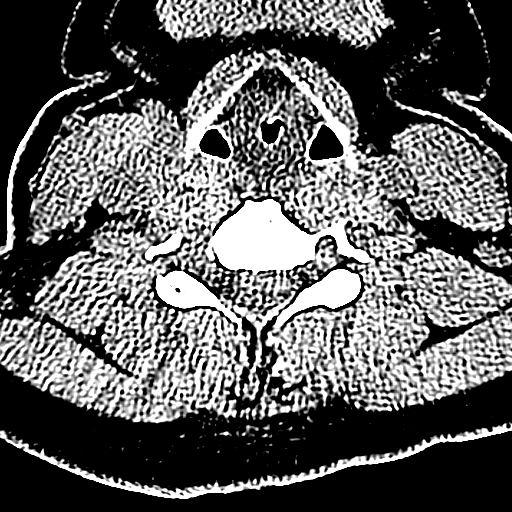
[im 45/90  bone]
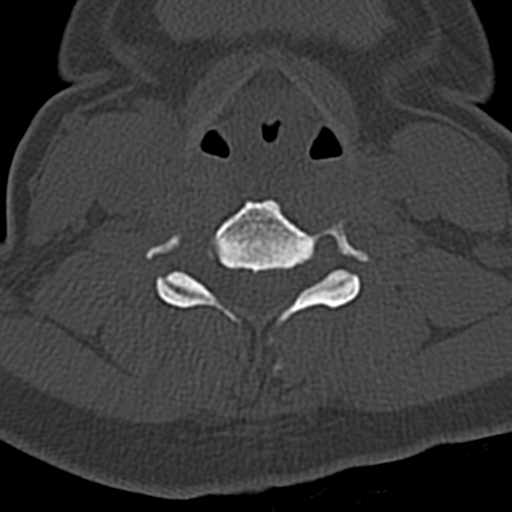
[im 54/90  brain]
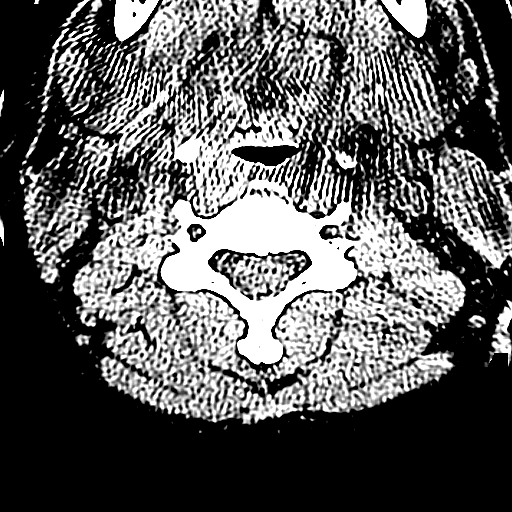
[im 63/90  brain]
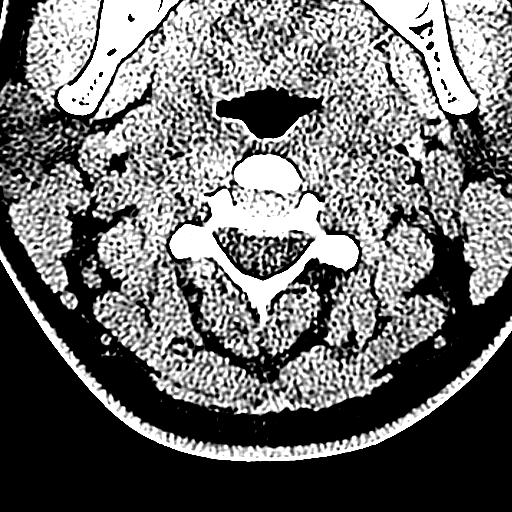
[im 72/90  brain]
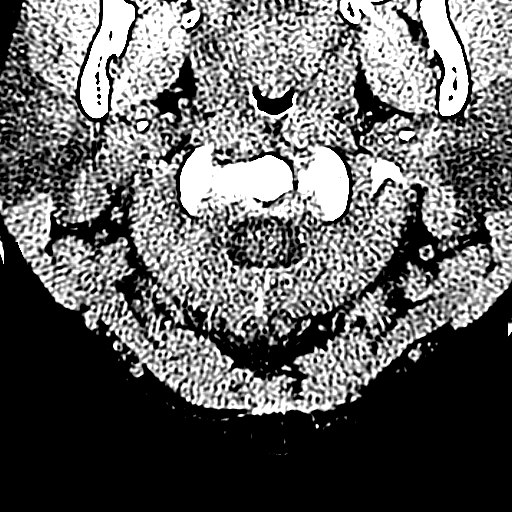
[im 81/90  brain]
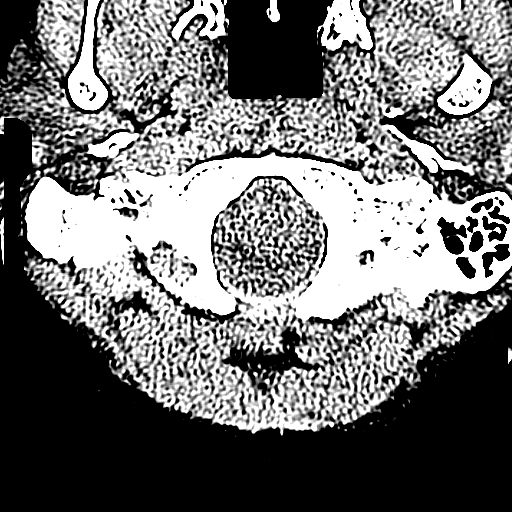
[im 81/90  bone]
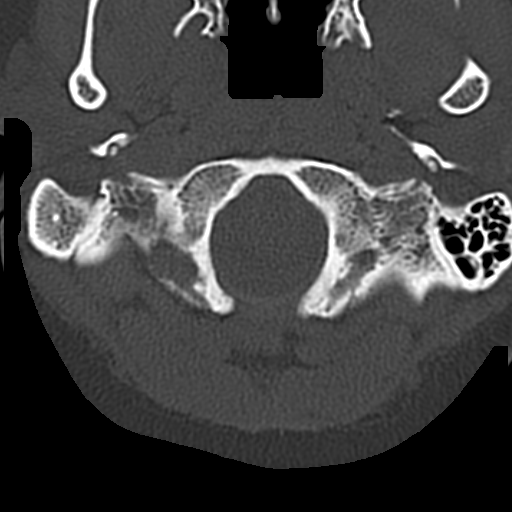

[Series 8: coronal · coronal · 0.26mm/px · 3 of 61 slices shown]
[im 21/61  brain]
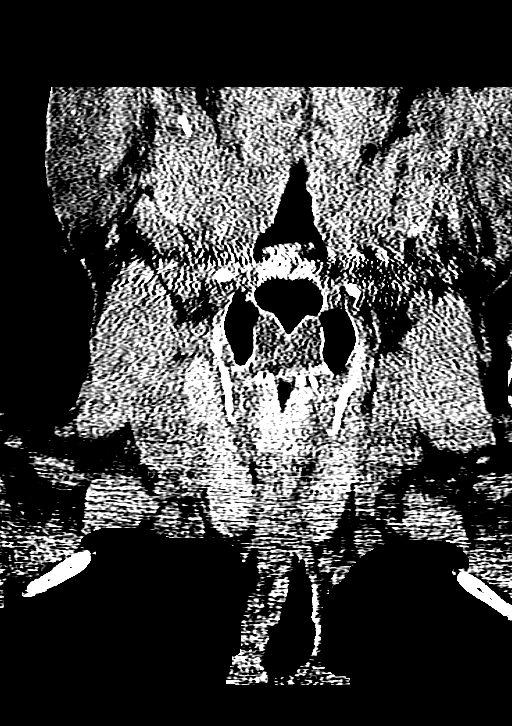
[im 27/61  brain]
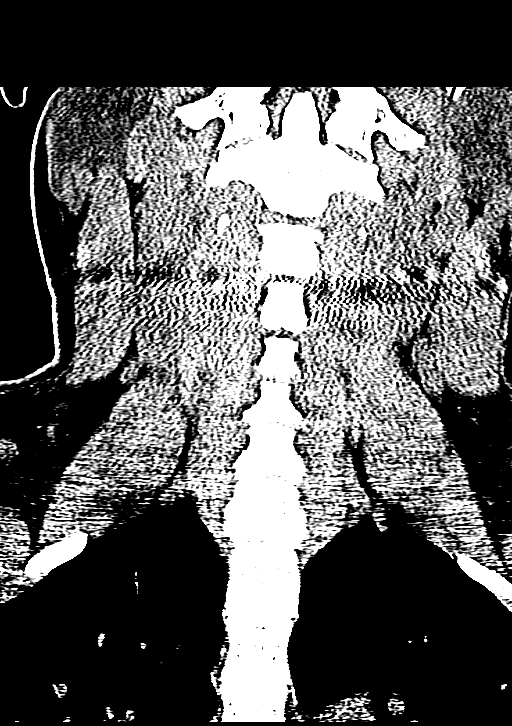
[im 34/61  brain]
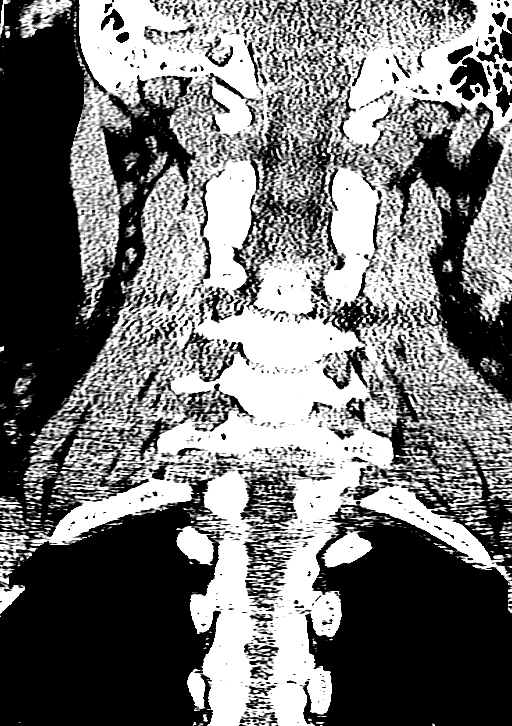

[Series 9: sagittal · sagittal · 0.30mm/px · 3 of 61 slices shown]
[im 21/61  brain]
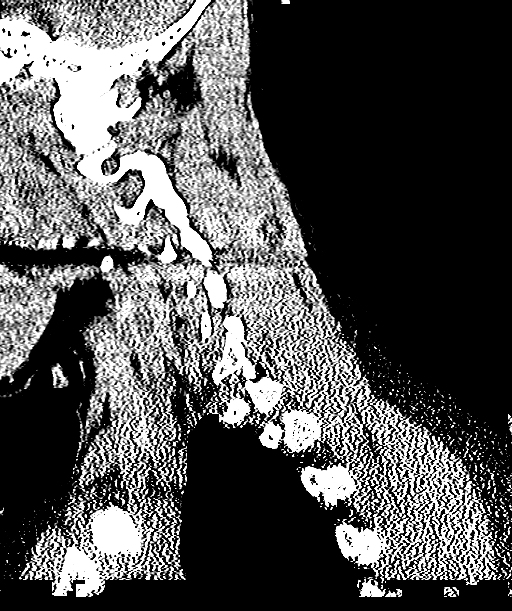
[im 31/61  brain]
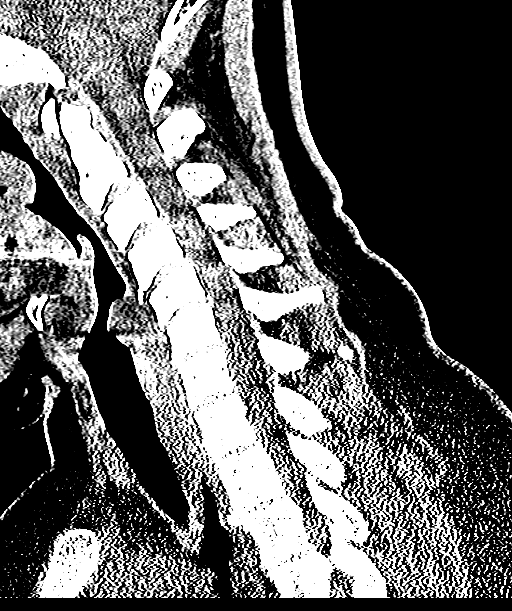
[im 41/61  brain]
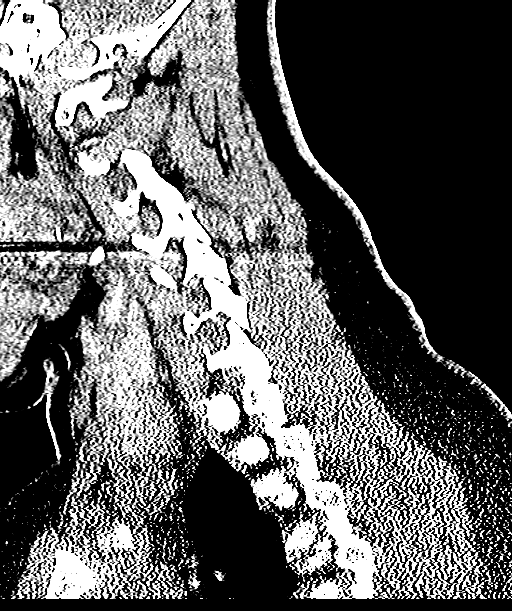

[15 of 47 positions shown; findings below may reference images not displayed]

FINDINGS: CT HEAD FINDINGS

There is no evidence for acute hemorrhage, hydrocephalus, mass
lesion, or abnormal extra-axial fluid collection. No definite CT
evidence for acute infarction. The visualized paranasal sinuses and
mastoid air cells are clear. No evidence for skull fracture.

CT CERVICAL SPINE FINDINGS

Imaging was obtained from the skullbase through the T3 vertebral
body. No fracture. No subluxation. Intervertebral disc spaces are
preserved throughout. Facets are well aligned bilaterally. No
evidence for prevertebral soft tissue edema. Reversal of the normal
cervical lordosis is evident.
IMPRESSION: 1. Normal CT evaluation of the brain.
2. No cervical spine fracture. Loss of cervical lordosis. This can
be related to patient positioning, muscle spasm or soft tissue
injury.

## 2018-03-27 ENCOUNTER — Encounter: Payer: Self-pay | Admitting: Emergency Medicine

## 2018-03-27 ENCOUNTER — Emergency Department (HOSPITAL_COMMUNITY)
Admission: EM | Admit: 2018-03-27 | Discharge: 2018-03-27 | Disposition: A | Discharge status: Home / Self Care | Payer: Self-pay | Attending: Emergency Medicine | Admitting: Emergency Medicine

## 2018-03-27 ENCOUNTER — Emergency Department (HOSPITAL_COMMUNITY): Payer: Self-pay

## 2018-03-27 DIAGNOSIS — J45909 Unspecified asthma, uncomplicated: Secondary | ICD-10-CM | POA: Insufficient documentation

## 2018-03-27 DIAGNOSIS — J209 Acute bronchitis, unspecified: Secondary | ICD-10-CM | POA: Insufficient documentation

## 2018-03-27 DIAGNOSIS — J40 Bronchitis, not specified as acute or chronic: Secondary | ICD-10-CM

## 2018-03-27 MED ORDER — PREDNISONE 20 MG PO TABS: 40.0000 mg | ORAL_TABLET | Freq: Every day | ORAL | 0 refills | Status: DC

## 2018-03-27 MED ORDER — AMOXICILLIN 500 MG PO CAPS: 1000.0000 mg | ORAL_CAPSULE | Freq: Two times a day (BID) | ORAL | 0 refills | Status: DC

## 2018-03-27 MED ORDER — ALBUTEROL SULFATE (2.5 MG/3ML) 0.083% IN NEBU
5.0000 mg | INHALATION_SOLUTION | Freq: Once | RESPIRATORY_TRACT | Status: AC
  Administered 2018-03-27: 5 mg via RESPIRATORY_TRACT
  Filled 2018-03-27: qty 6

## 2018-03-27 MED ORDER — ALBUTEROL SULFATE HFA 108 (90 BASE) MCG/ACT IN AERS
2.0000 | INHALATION_SPRAY | RESPIRATORY_TRACT | Status: DC | PRN
  Administered 2018-03-27: 2 via RESPIRATORY_TRACT
  Filled 2018-03-27: qty 6.7

## 2018-03-27 NOTE — ED Provider Notes (Signed)
[facility]-EMERGENCY DEPT Provider Note   CSN: [id number] Arrival date & time: 03/27/18  0104     History   Chief Complaint Chief Complaint  Patient presents with  . Shortness of Breath    HPI Peggy Nguyen is a 37 y.o. female.  Patient presents to the emergency department for evaluation of cough, chest congestion, shortness of breath.  Patient reports that symptoms began 2 or 3 weeks ago.  She has had persistent cough despite taking over-the-counter medications.  She has started to feel like she is wheezing.  She had a history of asthma when she was a child and occasionally gets wheezing when she gets sick as an adult.     Past Medical History:  Diagnosis Date  . Asthma    childhood    There are no active problems to display for this patient.   Past Surgical History:  Procedure Laterality Date  . DILATION AND EVACUATION N/A 01/09/2014   Procedure: DILATATION AND EVACUATION;  Surgeon: Freddrick March. Tenny Craw, MD;  Location: [facility];  Service: Gynecology;  Laterality: N/A;  . DILATION AND EVACUATION N/A 10/20/2014   Procedure: DILATATION AND EVACUATION;  Surgeon: Marlow Baars, MD;  Location: [facility];  Service: Gynecology;  Laterality: N/A;  . DILATION AND EVACUATION N/A 07/09/2015   Procedure: DILATATION AND EVACUATION;  Surgeon: Waynard Reeds, MD;  Location: [facility];  Service: Gynecology;  Laterality: N/A;     OB History    Gravida  2   Para      Term      Preterm      AB  1   Living        SAB  1   TAB      Ectopic      Multiple      Live Births               Home Medications    Prior to Admission medications   Medication Sig Start Date End Date Taking? Authorizing Provider  acetaminophen (TYLENOL) 500 MG tablet Take 1,000 mg by mouth every 6 (six) hours as needed for moderate pain.   Yes [provider]  albuterol (PROVENTIL HFA;VENTOLIN HFA) 108 (90 BASE) MCG/ACT inhaler Inhale 2 puffs into the lungs every 4 (four) hours as  needed for wheezing or shortness of breath (cough). Patient not taking: Reported on 03/27/2018 05/29/15   Street, [place], PA-C  amoxicillin (AMOXIL) 500 MG capsule Take 2 capsules (1,000 mg total) by mouth 2 (two) times daily. 03/27/18   Gilda Crease, MD  ibuprofen (ADVIL,MOTRIN) 600 MG tablet Take 1 tablet (600 mg total) by mouth every 6 (six) hours as needed. Patient not taking: Reported on 03/27/2018 07/09/15   Waynard Reeds, MD  oxyCODONE-acetaminophen (ROXICET) 5-325 MG tablet Take 2 tablets by mouth every 4 (four) hours as needed. May take 1-2 tablets every 4-6 hours as needed for pain Patient not taking: Reported on 03/27/2018 07/09/15   Waynard Reeds, MD  predniSONE (DELTASONE) 20 MG tablet Take 2 tablets (40 mg total) by mouth daily with breakfast. 03/27/18   Pollina, Canary Brim, MD    Family History Family History  Problem Relation Age of Onset  . Asthma Mother   . Cancer Maternal Grandfather        prostate  . Diabetes Paternal Grandfather   . Lupus Sister   . Hearing loss Neg Hx     Social History Social History   Tobacco Use  . Smoking status:  Former Smoker    Packs/day: 0.50    Years: 5.00    Pack years: 2.50    Types: Cigarettes    Last attempt to quit: 11/03/2013    Years since quitting: 4.3  . Smokeless tobacco: Never Used  Substance Use Topics  . Alcohol use: No  . Drug use: No     Allergies   Shellfish allergy   Review of Systems Review of Systems  Respiratory: Positive for cough, shortness of breath and wheezing.   All other systems reviewed and are negative.    Physical Exam Updated Vital Signs BP 125/79 (BP Location: Right Arm)   Pulse 85   Temp 98.1 F (36.7 C) (Oral)   Resp 20   LMP 03/09/2018   SpO2 100%   Physical Exam  Constitutional: She is oriented to person, place, and time. She appears well-developed and well-nourished. No distress.  HENT:  Head: Normocephalic and atraumatic.  Right Ear: Hearing normal.  Left Ear:  Hearing normal.  Nose: Nose normal.  Mouth/Throat: Oropharynx is clear and moist and mucous membranes are normal.  Eyes: Pupils are equal, round, and reactive to light. Conjunctivae and EOM are normal.  Neck: Normal range of motion. Neck supple.  Cardiovascular: Regular rhythm, S1 normal and S2 normal. Exam reveals no gallop and no friction rub.  No murmur heard. Pulmonary/Chest: Effort normal and breath sounds normal. No respiratory distress. She exhibits no tenderness.  Abdominal: Soft. Normal appearance and bowel sounds are normal. There is no hepatosplenomegaly. There is no tenderness. There is no rebound, no guarding, no tenderness at McBurney's point and negative Murphy's sign. No hernia.  Musculoskeletal: Normal range of motion.  Neurological: She is alert and oriented to person, place, and time. She has normal strength. No cranial nerve deficit or sensory deficit. Coordination normal. GCS eye subscore is 4. GCS verbal subscore is 5. GCS motor subscore is 6.  Skin: Skin is warm, dry and intact. No rash noted. No cyanosis.  Psychiatric: She has a normal mood and affect. Her speech is normal and behavior is normal. Thought content normal.  Nursing note and vitals reviewed.    ED Treatments / Results  Labs (all labs ordered are listed, but only abnormal results are displayed) Labs Reviewed - No data to display  EKG None  Radiology Dg Chest 2 View  Result Date: 03/27/2018 CLINICAL DATA:  Wheezing, left side chest pain, shortness of breath EXAM: CHEST - 2 VIEW COMPARISON:  01/27/2017 FINDINGS: Heart and mediastinal contours are within normal limits. No focal opacities or effusions. No acute bony abnormality. IMPRESSION: No active cardiopulmonary disease. Electronically Signed   By: Charlett Nose M.D.   On: 03/27/2018 02:21    Procedures Procedures (including critical care time)  Medications Ordered in ED Medications  albuterol (PROVENTIL HFA;VENTOLIN HFA) 108 (90 Base) MCG/ACT  inhaler 2 puff (has no administration in time range)  albuterol (PROVENTIL) (2.5 MG/3ML) 0.083% nebulizer solution 5 mg (5 mg Nebulization Given 03/27/18 0146)     Initial Impression / Assessment and Plan / ED Course  I have reviewed the triage vital signs and the nursing notes.  Pertinent labs & imaging results that were available during my care of the patient were reviewed by me and considered in my medical decision making (see chart for details).     Presents with cough, chest congestion, shortness of breath with wheezing for 3 weeks.  She has a history of bronchospasm with upper respiratory infections.  She does not currently have  any albuterol.  She was given albuterol by nebulizer by nursing protocol prior to my evaluating her.  She had significant wheezing initially but by the time I evaluated her it was resolved.  She reports significant improvement.  Normal oxygenation.  Will discharge with prednisone, albuterol, empiric antibiotic.  Final Clinical Impressions(s) / ED Diagnoses   Final diagnoses:  Bronchitis    ED Discharge Orders         Ordered    amoxicillin (AMOXIL) 500 MG capsule  2 times daily,   Status:  Discontinued     03/27/18 0609    predniSONE (DELTASONE) 20 MG tablet  Daily with breakfast     03/27/18 0609    amoxicillin (AMOXIL) 500 MG capsule  2 times daily     03/27/18 0610           Gilda Crease, MD 03/27/18 [phone]

## 2018-03-27 NOTE — ED Triage Notes (Signed)
Pt complains of wheezing, shortness of breath and a productive cough for two weeks

## 2019-03-22 IMAGING — CR DG CHEST 2V
2 series · 2 of 2 positions shown · non-contrast
Comparison: 01/27/2017

CLINICAL DATA: Wheezing, left side chest pain, shortness of breath

EXAM:
CHEST - 2 VIEW

[w chest pa]
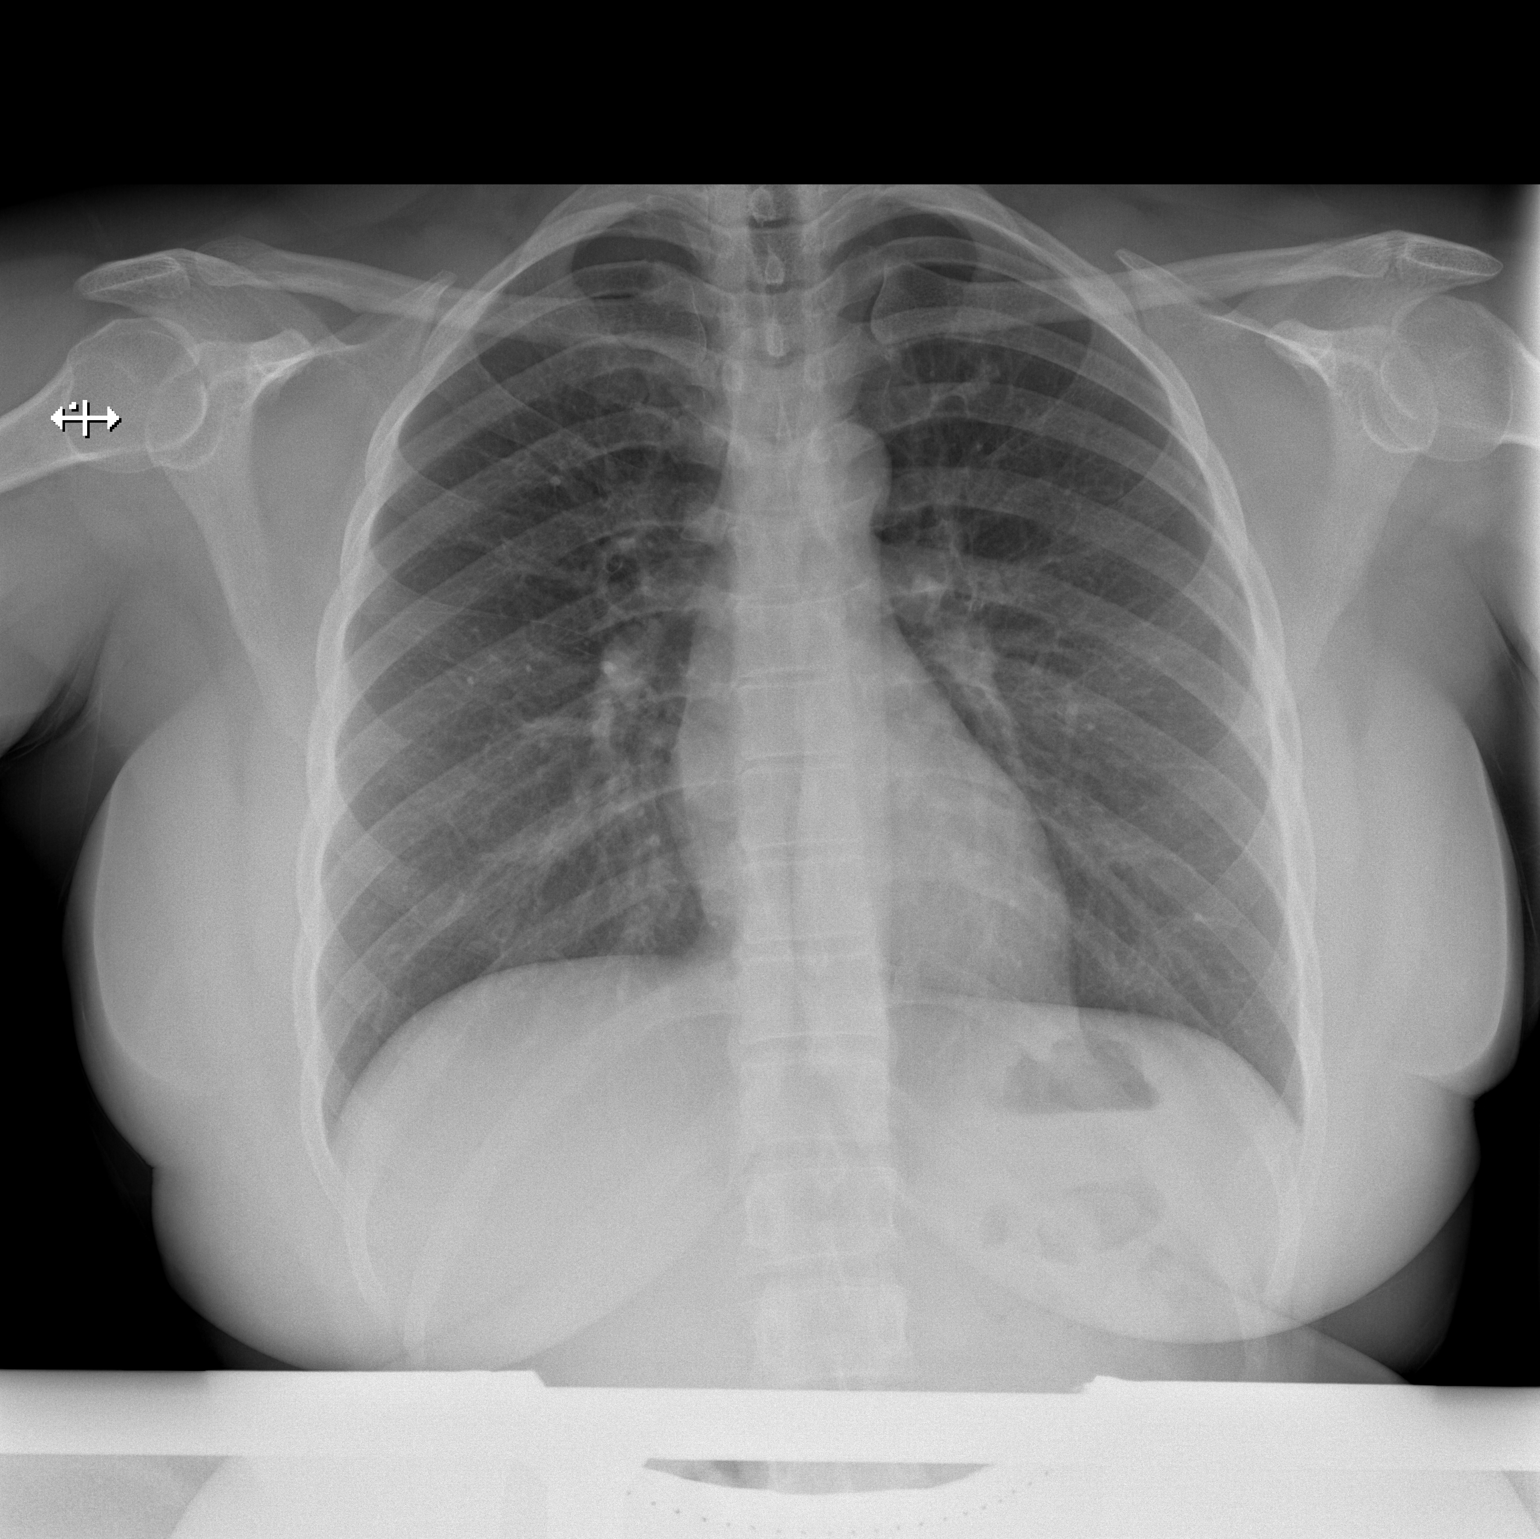

[w chest lat]
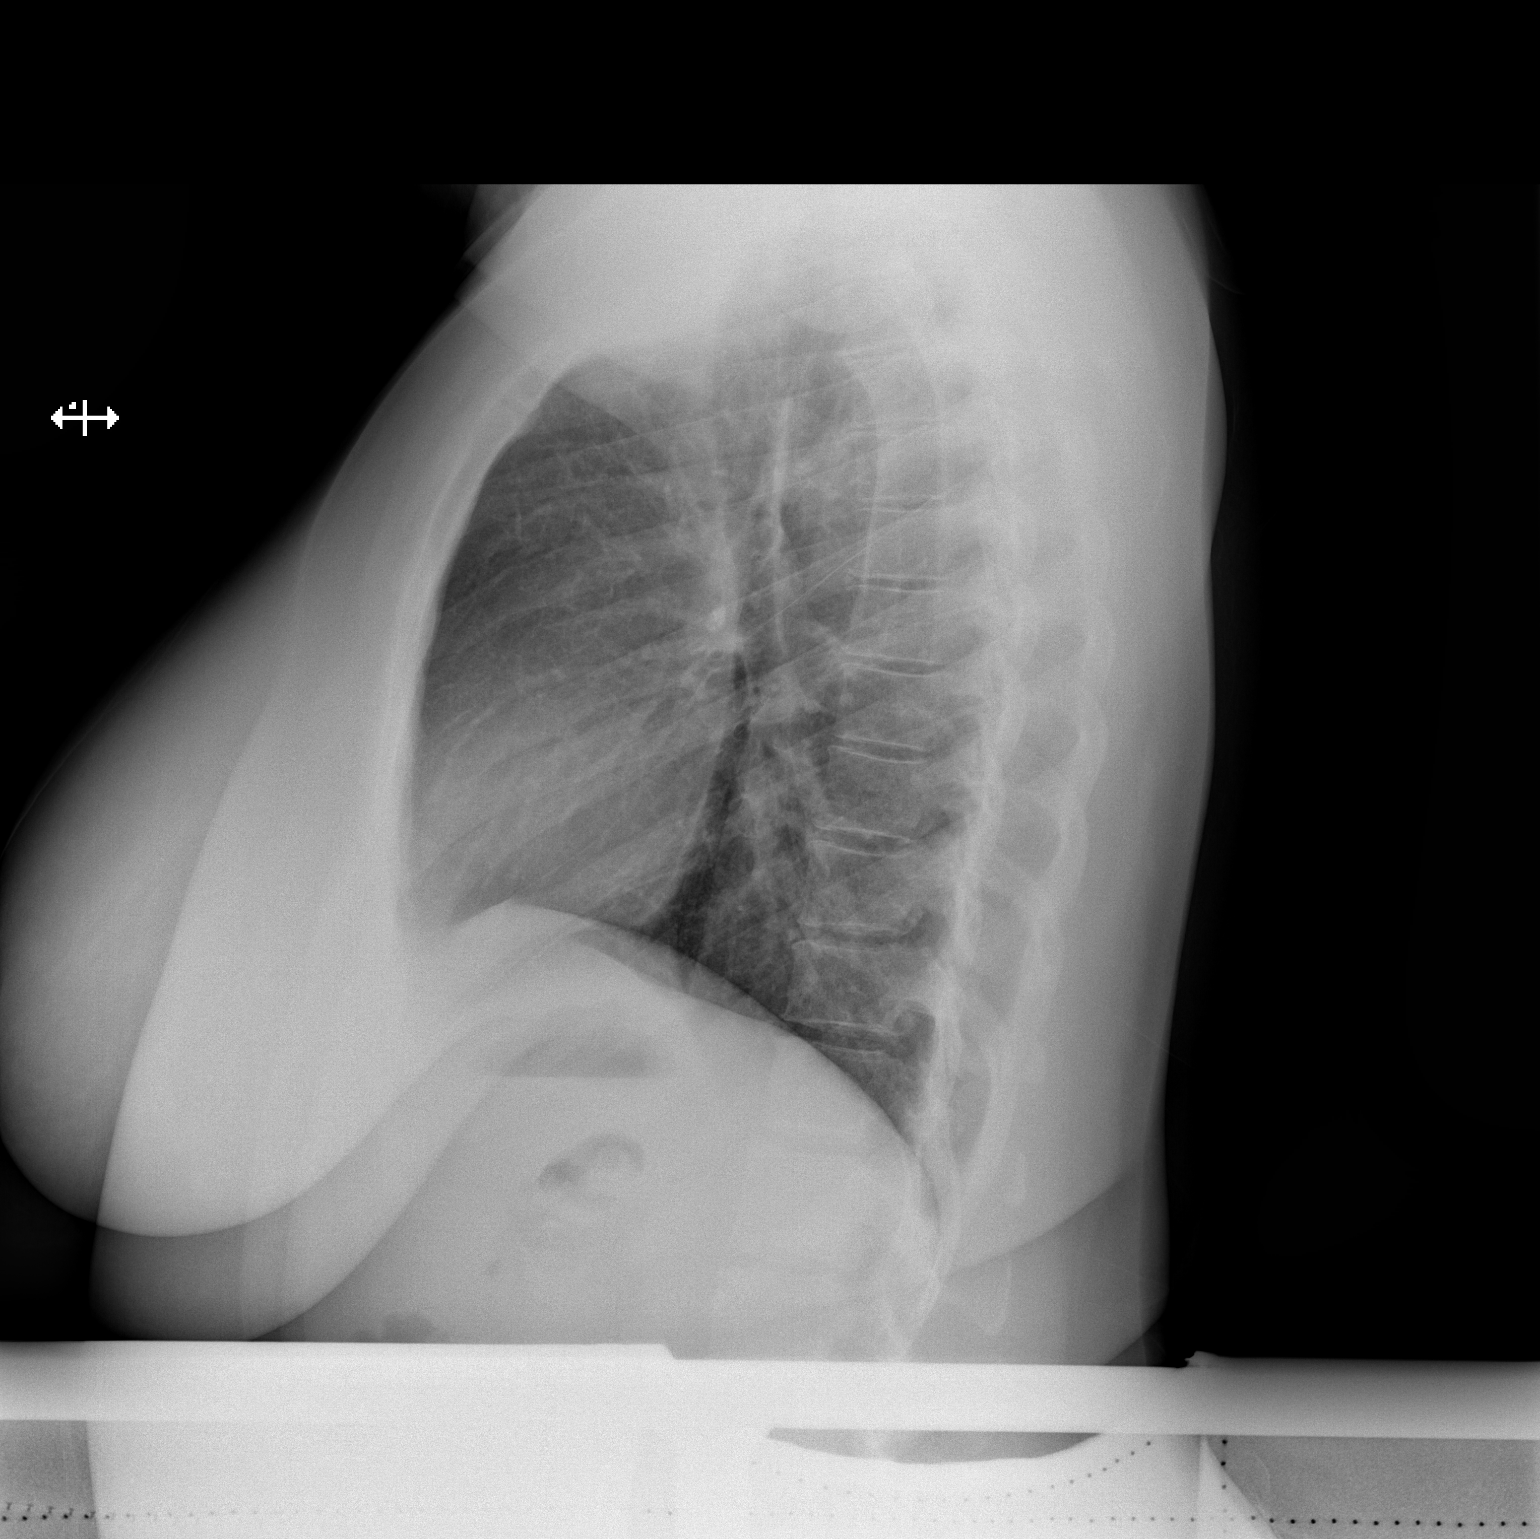

[2 of 2 positions shown; findings below may reference images not displayed]

FINDINGS: Heart and mediastinal contours are within normal limits. No focal
opacities or effusions. No acute bony abnormality.
IMPRESSION: No active cardiopulmonary disease.

## 2019-12-12 ENCOUNTER — Encounter (HOSPITAL_COMMUNITY): Payer: Self-pay | Admitting: Obstetrics and Gynecology

## 2019-12-13 ENCOUNTER — Encounter (HOSPITAL_COMMUNITY): Payer: Self-pay | Admitting: Obstetrics and Gynecology

## 2019-12-13 ENCOUNTER — Other Ambulatory Visit (HOSPITAL_COMMUNITY)
Admission: RE | Admit: 2019-12-13 | Discharge: 2019-12-13 | Disposition: A | Discharge status: Home / Self Care | Payer: Medicaid Other | Source: Ambulatory Visit | Attending: Obstetrics and Gynecology | Admitting: Obstetrics and Gynecology

## 2019-12-13 DIAGNOSIS — Z20822 Contact with and (suspected) exposure to covid-19: Secondary | ICD-10-CM | POA: Insufficient documentation

## 2019-12-13 DIAGNOSIS — Z01812 Encounter for preprocedural laboratory examination: Secondary | ICD-10-CM | POA: Insufficient documentation

## 2019-12-13 LAB — SARS CORONAVIRUS 2 (TAT 6-24 HRS): SARS Coronavirus 2: NEGATIVE

## 2019-12-13 SURGERY — LAPAROSCOPY, WITH ECTOPIC PREGNANCY SURGICAL TREATMENT
Anesthesia: General

## 2019-12-13 NOTE — Progress Notes (Addendum)
Patient denies chest pain, shortness of breath, or cardiology visit. Educated on Museum/gallery conservator. Requested that patient report to [facility] ED for check-in and the will be brought up to Short Stay for surgery. Reports she will remain in quarantine until after her procedure.

## 2019-12-13 NOTE — H&P (Addendum)
Peggy Nguyen is an 54 [id number] female who presented for dating [place] in office on 12/12/19. Pt was noted to have a gestational sac but no yolk sac or fetal pole noted.  Results of [place] were reviewed with pt and options offered included conservative management over next two weeks for spontaneous passage of products of conception vs misoprostol for abortion induction. 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  Risks/benefits reviewed Pt with no current bleeding or cramping.  Pt scheduled for 12/14/19 per her request given work obligations  Pertinent Gynecological History: Menses: n/a Bleeding: n/a Contraception: none DES exposure: unknown Blood transfusions: none Sexually transmitted diseases: no past history Previous GYN Procedures: DNC  Last mammogram: never Date: n/a Last pap: normal Date: unsure  OB History: G6, P0060   Menstrual History: Menarche age: unsure No LMP recorded.    Past Medical History:  Diagnosis Date  . Anemia   . Asthma    childhood    Past Surgical History:  Procedure Laterality Date  . DILATION AND EVACUATION N/A 01/09/2014   Procedure: DILATATION AND EVACUATION;  Surgeon: Freddrick March. Tenny Craw, MD;  Location: [facility];  Service: Gynecology;  Laterality: N/A;  . DILATION AND EVACUATION N/A 10/20/2014   Procedure: DILATATION AND EVACUATION;  Surgeon: Marlow Baars, MD;  Location: [facility];  Service: Gynecology;  Laterality: N/A;  . DILATION AND EVACUATION N/A 07/09/2015   Procedure: DILATATION AND EVACUATION;  Surgeon: Waynard Reeds, MD;  Location: [facility];  Service: Gynecology;  Laterality: N/A;    Family History  Problem Relation Age of Onset  . Asthma Mother   . Cancer Maternal Grandfather        prostate  . Diabetes Paternal Grandfather   . Lupus Sister   . Hearing loss Neg Hx     Social History:  reports that she quit  smoking about 6 years ago. Her smoking use included cigarettes. She has a 2.50 pack-year smoking history. She has never used smokeless tobacco. She reports that she does not drink alcohol and does not use drugs.  Allergies:  Allergies  Allergen Reactions  . Shellfish Allergy Swelling    No medications prior to admission.    Review of Systems  unknown if currently breastfeeding. Physical Exam  No results found for this or any previous visit (from the past 24 hour(s)).  No results found.  Assessment/Plan: 38yo G6P now 0060 with missed pregnancy - recurrent -Admit for outpt suction dilation and evacuation of products of conception -NPO per ERAS -Covid screen -LR  -SCDs to OR T&S and rhogam if indicated -Doxycline 100mg  iv x 1 preop then 200mg  po post op -Confirm consent  -To OR when ready  Peggy Nguyen 12/13/2019, 5:12 AM

## 2019-12-14 ENCOUNTER — Encounter (HOSPITAL_COMMUNITY): Payer: Self-pay | Admitting: Obstetrics and Gynecology

## 2019-12-14 ENCOUNTER — Other Ambulatory Visit: Payer: Self-pay

## 2019-12-14 ENCOUNTER — Ambulatory Visit (HOSPITAL_COMMUNITY): Payer: Medicaid Other | Admitting: Anesthesiology

## 2019-12-14 ENCOUNTER — Encounter (HOSPITAL_COMMUNITY)
Admission: AD | Disposition: A | Discharge status: Home / Self Care | Payer: Self-pay | Source: Home / Self Care | Attending: Obstetrics and Gynecology

## 2019-12-14 ENCOUNTER — Ambulatory Visit (HOSPITAL_COMMUNITY)
Admission: AD | Admit: 2019-12-14 | Discharge: 2019-12-14 | Disposition: A | Discharge status: Home / Self Care | Payer: Medicaid Other | Attending: Obstetrics and Gynecology | Admitting: Obstetrics and Gynecology

## 2019-12-14 DIAGNOSIS — Z6832 Body mass index (BMI) 32.0-32.9, adult: Secondary | ICD-10-CM | POA: Diagnosis not present

## 2019-12-14 DIAGNOSIS — Z87891 Personal history of nicotine dependence: Secondary | ICD-10-CM | POA: Diagnosis not present

## 2019-12-14 DIAGNOSIS — O021 Missed abortion: Secondary | ICD-10-CM | POA: Diagnosis not present

## 2019-12-14 DIAGNOSIS — Z3A01 Less than 8 weeks gestation of pregnancy: Secondary | ICD-10-CM | POA: Diagnosis not present

## 2019-12-14 DIAGNOSIS — E669 Obesity, unspecified: Secondary | ICD-10-CM | POA: Diagnosis not present

## 2019-12-14 HISTORY — DX: Anemia, unspecified: D64.9

## 2019-12-14 HISTORY — DX: Pneumonia, unspecified organism: J18.9

## 2019-12-14 HISTORY — PX: DILATION AND CURETTAGE OF UTERUS: SHX78

## 2019-12-14 LAB — CBC
HCT: 42 % (ref 36.0–46.0)
Hemoglobin: 13.7 g/dL (ref 12.0–15.0)
MCH: 27.3 pg (ref 26.0–34.0)
MCHC: 32.6 g/dL (ref 30.0–36.0)
MCV: 83.8 fL (ref 80.0–100.0)
Platelets: 367 10*3/uL (ref 150–400)
RBC: 5.01 MIL/uL (ref 3.87–5.11)
RDW: 13.9 % (ref 11.5–15.5)
WBC: 4 10*3/uL (ref 4.0–10.5)
nRBC: 0 % (ref 0.0–0.2)

## 2019-12-14 LAB — TYPE AND SCREEN
ABO/RH(D): O POS
Antibody Screen: NEGATIVE

## 2019-12-14 SURGERY — DILATION AND CURETTAGE
Anesthesia: General

## 2019-12-14 MED ORDER — SODIUM CHLORIDE 0.9 % IV SOLN
100.0000 mg | Freq: Once | INTRAVENOUS | Status: AC
  Administered 2019-12-14: 100 mg via INTRAVENOUS
  Filled 2019-12-14: qty 100

## 2019-12-14 MED ORDER — LIDOCAINE 2% (20 MG/ML) 5 ML SYRINGE
INTRAMUSCULAR | Status: DC | PRN
  Administered 2019-12-14: 80 mg via INTRAVENOUS

## 2019-12-14 MED ORDER — LACTATED RINGERS IV SOLN: INTRAVENOUS | Status: DC

## 2019-12-14 MED ORDER — OXYCODONE-ACETAMINOPHEN 5-325 MG PO TABS: 1.0000 | ORAL_TABLET | ORAL | 0 refills | Status: AC | PRN

## 2019-12-14 MED ORDER — LIDOCAINE 2% (20 MG/ML) 5 ML SYRINGE
INTRAMUSCULAR | Status: AC
  Filled 2019-12-14: qty 5

## 2019-12-14 MED ORDER — DEXAMETHASONE SODIUM PHOSPHATE 10 MG/ML IJ SOLN
INTRAMUSCULAR | Status: AC
  Filled 2019-12-14: qty 1

## 2019-12-14 MED ORDER — LIDOCAINE HCL (PF) 1 % IJ SOLN
INTRAMUSCULAR | Status: AC
  Filled 2019-12-14: qty 30

## 2019-12-14 MED ORDER — ROCURONIUM BROMIDE 10 MG/ML (PF) SYRINGE
PREFILLED_SYRINGE | INTRAVENOUS | Status: AC
  Filled 2019-12-14: qty 10

## 2019-12-14 MED ORDER — CHLORHEXIDINE GLUCONATE 0.12 % MT SOLN: 15.0000 mL | Freq: Once | OROMUCOSAL | Status: AC

## 2019-12-14 MED ORDER — MIDAZOLAM HCL 2 MG/2ML IJ SOLN
INTRAMUSCULAR | Status: AC
  Filled 2019-12-14: qty 2

## 2019-12-14 MED ORDER — ONDANSETRON HCL 4 MG/2ML IJ SOLN
INTRAMUSCULAR | Status: AC
  Filled 2019-12-14: qty 2

## 2019-12-14 MED ORDER — SUCCINYLCHOLINE CHLORIDE 200 MG/10ML IV SOSY
PREFILLED_SYRINGE | INTRAVENOUS | Status: AC
  Filled 2019-12-14: qty 10

## 2019-12-14 MED ORDER — PHENYLEPHRINE 40 MCG/ML (10ML) SYRINGE FOR IV PUSH (FOR BLOOD PRESSURE SUPPORT)
PREFILLED_SYRINGE | INTRAVENOUS | Status: AC
  Filled 2019-12-14: qty 20

## 2019-12-14 MED ORDER — ACETAMINOPHEN 500 MG PO TABS
ORAL_TABLET | ORAL | Status: AC
  Administered 2019-12-14: 1000 mg via ORAL
  Filled 2019-12-14: qty 2

## 2019-12-14 MED ORDER — PROPOFOL 10 MG/ML IV BOLUS
INTRAVENOUS | Status: AC
  Filled 2019-12-14: qty 20

## 2019-12-14 MED ORDER — KETOROLAC TROMETHAMINE 30 MG/ML IJ SOLN
INTRAMUSCULAR | Status: AC
  Filled 2019-12-14: qty 1

## 2019-12-14 MED ORDER — PHENYLEPHRINE 40 MCG/ML (10ML) SYRINGE FOR IV PUSH (FOR BLOOD PRESSURE SUPPORT)
PREFILLED_SYRINGE | INTRAVENOUS | Status: DC | PRN
  Administered 2019-12-14: 160 ug via INTRAVENOUS

## 2019-12-14 MED ORDER — DEXAMETHASONE SODIUM PHOSPHATE 10 MG/ML IJ SOLN
INTRAMUSCULAR | Status: DC | PRN
  Administered 2019-12-14: 10 mg via INTRAVENOUS

## 2019-12-14 MED ORDER — ORAL CARE MOUTH RINSE: 15.0000 mL | Freq: Once | OROMUCOSAL | Status: AC

## 2019-12-14 MED ORDER — IBUPROFEN 600 MG PO TABS: 600.0000 mg | ORAL_TABLET | Freq: Four times a day (QID) | ORAL | 1 refills | Status: AC | PRN

## 2019-12-14 MED ORDER — LIDOCAINE HCL 1 % IJ SOLN
INTRAMUSCULAR | Status: DC | PRN
  Administered 2019-12-14: 10 mL

## 2019-12-14 MED ORDER — ACETAMINOPHEN 500 MG PO TABS: 1000.0000 mg | ORAL_TABLET | ORAL | Status: AC

## 2019-12-14 MED ORDER — KETOROLAC TROMETHAMINE 30 MG/ML IJ SOLN
30.0000 mg | Freq: Once | INTRAMUSCULAR | Status: AC
  Administered 2019-12-14: 30 mg via INTRAVENOUS

## 2019-12-14 MED ORDER — ONDANSETRON HCL 4 MG/2ML IJ SOLN
INTRAMUSCULAR | Status: DC | PRN
  Administered 2019-12-14: 4 mg via INTRAVENOUS

## 2019-12-14 MED ORDER — PROPOFOL 10 MG/ML IV BOLUS
INTRAVENOUS | Status: DC | PRN
  Administered 2019-12-14: 200 mg via INTRAVENOUS

## 2019-12-14 MED ORDER — CHLORHEXIDINE GLUCONATE 0.12 % MT SOLN
OROMUCOSAL | Status: AC
  Administered 2019-12-14: 15 mL via OROMUCOSAL
  Filled 2019-12-14: qty 15

## 2019-12-14 MED ORDER — POVIDONE-IODINE 10 % EX SWAB
2.0000 "application " | Freq: Once | CUTANEOUS | Status: AC
  Administered 2019-12-14: 2 via TOPICAL

## 2019-12-14 MED ORDER — 0.9 % SODIUM CHLORIDE (POUR BTL) OPTIME
TOPICAL | Status: DC | PRN
  Administered 2019-12-14: 1000 mL

## 2019-12-14 MED ORDER — MIDAZOLAM HCL 5 MG/5ML IJ SOLN
INTRAMUSCULAR | Status: DC | PRN
  Administered 2019-12-14: 2 mg via INTRAVENOUS

## 2019-12-14 MED ORDER — FENTANYL CITRATE (PF) 100 MCG/2ML IJ SOLN
INTRAMUSCULAR | Status: DC | PRN
  Administered 2019-12-14: 50 ug via INTRAVENOUS

## 2019-12-14 MED ORDER — FENTANYL CITRATE (PF) 250 MCG/5ML IJ SOLN
INTRAMUSCULAR | Status: AC
  Filled 2019-12-14: qty 5

## 2019-12-14 SURGICAL SUPPLY — 14 items
CATH ROBINSON RED A/P 16FR (CATHETERS) ×3 IMPLANT
CNTNR URN SCR LID CUP LEK RST (MISCELLANEOUS) ×1 IMPLANT
CONT SPEC 4OZ STRL OR WHT (MISCELLANEOUS) ×2
GLOVE BIO SURGEON STRL SZ 6.5 (GLOVE) ×2 IMPLANT
GLOVE BIO SURGEONS STRL SZ 6.5 (GLOVE) ×1
GLOVE BIOGEL PI IND STRL 7.0 (GLOVE) ×1 IMPLANT
GLOVE BIOGEL PI INDICATOR 7.0 (GLOVE) ×2
GOWN STRL REUS W/ TWL LRG LVL3 (GOWN DISPOSABLE) ×2 IMPLANT
GOWN STRL REUS W/TWL LRG LVL3 (GOWN DISPOSABLE) ×4
KIT TURNOVER KIT B (KITS) ×3 IMPLANT
PACK VAGINAL MINOR WOMEN LF (CUSTOM PROCEDURE TRAY) ×3 IMPLANT
PAD OB MATERNITY 4.3X12.25 (PERSONAL CARE ITEMS) ×3 IMPLANT
TOWEL GREEN STERILE FF (TOWEL DISPOSABLE) ×6 IMPLANT
UNDERPAD 30X36 HEAVY ABSORB (UNDERPADS AND DIAPERS) ×3 IMPLANT

## 2019-12-14 NOTE — Transfer of Care (Signed)
Immediate Anesthesia Transfer of Care Note  Patient: Peggy Nguyen  Procedure(s) Performed: DILATATION AND CURETTAGE (N/A )  Patient Location: PACU  Anesthesia Type:General  Level of Consciousness: drowsy and patient cooperative  Airway & Oxygen Therapy: Patient Spontanous Breathing  Post-op Assessment: Report given to RN and Post -op Vital signs reviewed and stable  Post vital signs: Reviewed and stable  Last Vitals:  Vitals Value Taken Time  BP    Temp    Pulse    Resp    SpO2      Last Pain:  Vitals:   12/14/19 0820  TempSrc: Oral  PainSc:       Patients Stated Pain Goal: 3 (12/14/19 0817)  Complications: No complications documented.

## 2019-12-14 NOTE — Anesthesia Procedure Notes (Signed)
Procedure Name: LMA Insertion Date/Time: 12/14/2019 9:09 AM Performed by: Rosiland Oz, CRNA Pre-anesthesia Checklist: Patient identified, Emergency Drugs available, Suction available, Patient being monitored and Timeout performed Patient Re-evaluated:Patient Re-evaluated prior to induction Oxygen Delivery Method: Circle system utilized Preoxygenation: Pre-oxygenation with 100% oxygen Induction Type: IV induction LMA: LMA inserted LMA Size: 3.0 Number of attempts: 1 Placement Confirmation: positive ETCO2 and breath sounds checked- equal and bilateral Tube secured with: Tape Dental Injury: Teeth and Oropharynx as per pre-operative assessment

## 2019-12-14 NOTE — Discharge Summary (Signed)
Physician Discharge Summary  Patient ID: Peggy Nguyen MRN: [id number] DOB/AGE: [birth year] 39 y.o.  Admit date: 12/14/2019 Discharge date: 12/14/2019  Admission Diagnoses:  Discharge Diagnoses:  Active Problems:   Missed abortion   Discharged Condition: stable  Hospital Course: Pt admitted for planned suction D&E du eot missed abortion at 5-6 weeks per pt request. Procedure was uncomplicated. Pt discharged to home following procedure in stable condition  Consults: None  Significant Diagnostic Studies: labs: cbc wnl  Treatments: surgery: Suction D&E  Discharge Exam: Blood pressure 110/72, pulse 74, temperature (!) 97.3 F (36.3 C), resp. rate 14, height 5\' 1"  (1.549 m), weight 77.6 kg, SpO2 100 %, unknown if currently breastfeeding. General appearance: no distress  Disposition:    Allergies as of 12/14/2019      Reactions   Shellfish Allergy Swelling   Can tolerate betadine      Medication List    TAKE these medications   ibuprofen 600 MG tablet Commonly known as: ADVIL Take 1 tablet (600 mg total) by mouth every 6 (six) hours as needed for moderate pain or cramping. What changed: reasons to take this   oxyCODONE-acetaminophen 5-325 MG tablet Commonly known as: Percocet Take 1 tablet by mouth every 4 (four) hours as needed for up to 5 days for severe pain. What changed:   how much to take  reasons to take this  additional instructions        Signed: 02/14/2020 12/14/2019, 9:51 AM

## 2019-12-14 NOTE — Op Note (Signed)
Operative Note    Preoperative Diagnosis: Missed pregnancy at 5 weeks   Postoperative Diagnosis: Same   Procedure: Suction dilation and evacuation of products of conception  Surgeon: Britt Bottom DO  Anesthesia: General  Fluids:LR 500-1000cc EBL: scant <10cc UOP: 50cc   Findings: Uterus sounded to 7.5cm; small amount of products of conception only   Specimen: Products of conception to pathology   Procedure Note  Consent was confirmed from  pt. Patient was taken to the operating room where general anesthesia was administered without difficulty. She was then prepped and draped in the normal sterile fashion while in the dorsal lithotomy position. An appropriate time out was performed. An exam under anesthesia noted an anteverted uterus.  A speculum was then placed within the vagina and the anterior lip of the cervix identified and grasped with a single toothed tenaculum. Uterus was then sounded to 7.5cm.  The Pratt dilators were utilized to dilate the cervix up to approximately 23. A size 7 rigid cannula was then inserted into the uterine cavity and suction begun. There was small amount of products evacuated. After 4 passes, a sharp curettage was then performed. Four more passes with the suction cannula were done. Scant amount of products were returned this time. A final currettage noted a gritty texture in all four quadrants.  At this point, the tenaculum was removed; site was noted to be hemostatic.  There was scant to no bleeding from cervix. The speculum was thus removed as well. Pt was cleaned and awakened. She was taken to the recovery room in stable condition.  Counts were correct per nursing staff during and at the end of the procedure

## 2019-12-14 NOTE — Interval H&P Note (Signed)
History and Physical Interval Note: Pt seen. No change from H/P. Reports no cramping or bleeding. Reviewed procedure and intra and post op expectations, risks/benefits. Pt to follow up with Dr Jackelyn Knife in 1-2 weeks given I will be out of office.  Consent confirmed To OR when ready  12/14/2019 8:48 AM  Peggy Nguyen  has presented today for surgery, with the diagnosis of missed abortion.  The various methods of treatment have been discussed with the patient and family. After consideration of risks, benefits and other options for treatment, the patient has consented to  Procedure(s): DILATATION AND CURETTAGE (N/A) as a surgical intervention.  The patient's history has been reviewed, patient examined, no change in status, stable for surgery.  I have reviewed the patient's chart and labs.  Questions were answered to the patient's satisfaction.     Cathrine Muster

## 2019-12-14 NOTE — Anesthesia Preprocedure Evaluation (Addendum)
Anesthesia Evaluation  Patient identified by MRN, date of birth, ID band Patient awake    Reviewed: Allergy & Precautions, NPO status , Patient's Chart, lab work & pertinent test results  History of Anesthesia Complications Negative for: history of anesthetic complications  Airway Mallampati: II  TM Distance: >3 FB Neck ROM: Full    Dental  (+) Dental Advisory Given, Teeth Intact   Pulmonary asthma (childhood) , former smoker,    Pulmonary exam normal        Cardiovascular negative cardio ROS Normal cardiovascular exam     Neuro/Psych negative neurological ROS  negative psych ROS   GI/Hepatic negative GI ROS, Neg liver ROS,   Endo/Other   Obesity   Renal/GU negative Renal ROS     Musculoskeletal negative musculoskeletal ROS (+)   Abdominal   Peds  Hematology negative hematology ROS (+)   Anesthesia Other Findings Covid test negative   Reproductive/Obstetrics                            Anesthesia Physical Anesthesia Plan  ASA: II  Anesthesia Plan: General   Post-op Pain Management:    Induction: Intravenous  PONV Risk Score and Plan: 3 and Treatment may vary due to age or medical condition, Ondansetron, Midazolam and Dexamethasone  Airway Management Planned: LMA  Additional Equipment: None  Intra-op Plan:   Post-operative Plan: Extubation in OR  Informed Consent: I have reviewed the patients History and Physical, chart, labs and discussed the procedure including the risks, benefits and alternatives for the proposed anesthesia with the patient or authorized representative who has indicated his/her understanding and acceptance.     Dental advisory given  Plan Discussed with: CRNA and Anesthesiologist  Anesthesia Plan Comments:        Anesthesia Quick Evaluation

## 2019-12-14 NOTE — Anesthesia Postprocedure Evaluation (Signed)
Anesthesia Post Note  Patient: Peggy Nguyen  Procedure(s) Performed: DILATATION AND CURETTAGE (N/A )     Patient location during evaluation: PACU Anesthesia Type: General Level of consciousness: awake Pain management: pain level controlled Vital Signs Assessment: post-procedure vital signs reviewed and stable Respiratory status: spontaneous breathing Cardiovascular status: stable Postop Assessment: no apparent nausea or vomiting Anesthetic complications: no   No complications documented.  Last Vitals:  Vitals:   12/14/19 0955 12/14/19 1010  BP: 121/75 129/66  Pulse: 71 66  Resp: 14 17  Temp:    SpO2: 100% 100%    Last Pain:  Vitals:   12/14/19 1010  TempSrc:   PainSc: 0-No pain   Pain Goal: Patients Stated Pain Goal: 3 (12/14/19 0817)                 Caren Macadam

## 2019-12-15 ENCOUNTER — Encounter (HOSPITAL_COMMUNITY): Payer: Self-pay | Admitting: Obstetrics and Gynecology

## 2019-12-16 LAB — SURGICAL PATHOLOGY

## 2020-07-03 ENCOUNTER — Other Ambulatory Visit: Payer: Self-pay

## 2020-07-03 ENCOUNTER — Encounter (HOSPITAL_COMMUNITY): Payer: Self-pay

## 2020-07-03 ENCOUNTER — Emergency Department (HOSPITAL_COMMUNITY)
Admission: EM | Admit: 2020-07-03 | Discharge: 2020-07-03 | Disposition: A | Discharge status: Home / Self Care | Payer: Medicaid Other | Attending: Emergency Medicine | Admitting: Emergency Medicine

## 2020-07-03 DIAGNOSIS — J45909 Unspecified asthma, uncomplicated: Secondary | ICD-10-CM | POA: Diagnosis not present

## 2020-07-03 DIAGNOSIS — Z87891 Personal history of nicotine dependence: Secondary | ICD-10-CM | POA: Insufficient documentation

## 2020-07-03 DIAGNOSIS — R059 Cough, unspecified: Secondary | ICD-10-CM | POA: Diagnosis not present

## 2020-07-03 DIAGNOSIS — R0989 Other specified symptoms and signs involving the circulatory and respiratory systems: Secondary | ICD-10-CM | POA: Diagnosis not present

## 2020-07-03 DIAGNOSIS — Z20822 Contact with and (suspected) exposure to covid-19: Secondary | ICD-10-CM | POA: Diagnosis not present

## 2020-07-03 DIAGNOSIS — R0602 Shortness of breath: Secondary | ICD-10-CM | POA: Diagnosis not present

## 2020-07-03 MED ORDER — ALBUTEROL SULFATE (2.5 MG/3ML) 0.083% IN NEBU: 2.5000 mg | INHALATION_SOLUTION | Freq: Once | RESPIRATORY_TRACT | Status: DC

## 2020-07-03 MED ORDER — ALBUTEROL SULFATE HFA 108 (90 BASE) MCG/ACT IN AERS: 1.0000 | INHALATION_SPRAY | Freq: Four times a day (QID) | RESPIRATORY_TRACT | 0 refills | Status: DC | PRN

## 2020-07-03 NOTE — Discharge Instructions (Addendum)
If you had any testing done today, the results will show up on your MyChart phone app in 24 hours.  Call your primary care doctor in the next 1-2 days to arrange video follow-up.   Use a finger pulse oximeter at home.  You may purchase one at [facility] or [facility] or online.  If the numbers drops and stays below 90%, return immediately back to the ER.  Otherwise increase your fluid intake, isolate at home for 5 days after symptoms resolve, and inform recent close contacts of the need to test for Covid.  

## 2020-07-03 NOTE — ED Triage Notes (Signed)
Pt came in with c/o SOB, cough and nasal congestion that started two days ago. O2 saturations are 100% on RA. Pt hasn't taken any medications for it

## 2020-07-03 NOTE — ED Provider Notes (Signed)
[facility]-EMERGENCY DEPT Provider Note   CSN: [id number] Arrival date & time: 07/03/20  2107     History Chief Complaint  Patient presents with  . Shortness of Breath  . Cough    Peggy Nguyen is a 40 y.o. female.  Patient presents ER chief complaint of cough congestion and shortness of breath.  Symptoms ongoing for 2 days now.  Denies chest pain.  Denies vomiting or diarrhea.  No known Covid exposures.  Patient states that she was vaccinated several months ago.        Past Medical History:  Diagnosis Date  . Anemia   . Asthma    childhood  . Pneumonia     Patient Active Problem List   Diagnosis Date Noted  . Missed abortion 12/14/2019    Past Surgical History:  Procedure Laterality Date  . DILATION AND CURETTAGE OF UTERUS N/A 12/14/2019   Procedure: DILATATION AND CURETTAGE;  Surgeon: Edwinna Areola, DO;  Location: [facility] OR;  Service: Gynecology;  Laterality: N/A;  . DILATION AND EVACUATION N/A 01/09/2014   Procedure: DILATATION AND EVACUATION;  Surgeon: Freddrick March. Tenny Craw, MD;  Location: [facility] ORS;  Service: Gynecology;  Laterality: N/A;  . DILATION AND EVACUATION N/A 10/20/2014   Procedure: DILATATION AND EVACUATION;  Surgeon: Marlow Baars, MD;  Location: [facility] ORS;  Service: Gynecology;  Laterality: N/A;  . DILATION AND EVACUATION N/A 07/09/2015   Procedure: DILATATION AND EVACUATION;  Surgeon: Waynard Reeds, MD;  Location: [facility] ORS;  Service: Gynecology;  Laterality: N/A;     OB History    Gravida  2   Para      Term      Preterm      AB  1   Living        SAB  1   IAB      Ectopic      Multiple      Live Births              Family History  Problem Relation Age of Onset  . Asthma Mother   . Cancer Maternal Grandfather        prostate  . Diabetes Paternal Grandfather   . Lupus Sister   . Hearing loss Neg Hx     Social History   Tobacco Use  . Smoking status: Former Smoker    Packs/day: 0.50    Years: 5.00    Pack  years: 2.50    Types: Cigarettes    Quit date: 11/03/2013    Years since quitting: 6.6  . Smokeless tobacco: Never Used  Substance Use Topics  . Alcohol use: No  . Drug use: No    Home Medications Prior to Admission medications   Medication Sig Start Date End Date Taking? Authorizing Provider  ibuprofen (ADVIL) 600 MG tablet Take 1 tablet (600 mg total) by mouth every 6 (six) hours as needed for moderate pain or cramping. 12/14/19   Edwinna Areola, DO    Allergies    Shellfish allergy  Review of Systems   Review of Systems  Constitutional: Negative for fever.  HENT: Negative for ear pain.   Eyes: Negative for pain.  Respiratory: Positive for cough.   Cardiovascular: Negative for chest pain.  Gastrointestinal: Negative for abdominal pain.  Genitourinary: Negative for flank pain.  Musculoskeletal: Negative for back pain.  Skin: Negative for rash.  Neurological: Negative for headaches.    Physical Exam Updated Vital Signs BP 133/70 (BP Location: Right Arm)  Pulse 100   Temp 98.5 F (36.9 C) (Oral)   Resp 18   Ht 5\' 1"  (1.549 m)   Wt 77.6 kg   SpO2 100%   BMI 32.31 kg/m   Physical Exam Constitutional:      General: She is not in acute distress.    Appearance: Normal appearance.  HENT:     Head: Normocephalic.     Nose: Nose normal.  Eyes:     Extraocular Movements: Extraocular movements intact.  Cardiovascular:     Rate and Rhythm: Normal rate.  Pulmonary:     Effort: Pulmonary effort is normal.  Musculoskeletal:        General: Normal range of motion.     Cervical back: Normal range of motion.  Neurological:     General: No focal deficit present.     Mental Status: She is alert. Mental status is at baseline.     ED Results / Procedures / Treatments   Labs (all labs ordered are listed, but only abnormal results are displayed) Labs Reviewed  SARS CORONAVIRUS 2 (TAT 6-24 HRS)    EKG None  Radiology No results  found.  Procedures Procedures   Medications Ordered in ED Medications  albuterol (PROVENTIL) (2.5 MG/3ML) 0.083% nebulizer solution 2.5 mg (has no administration in time range)    ED Course  I have reviewed the triage vital signs and the nursing notes.  Pertinent labs & imaging results that were available during my care of the patient were reviewed by me and considered in my medical decision making (see chart for details).    MDM Rules/Calculators/A&P                          Patient mildly tachycardic otherwise normal vital signs and present on room air which is appropriate.  Presents with symptoms of viral URI.  Covid test sent and pending results.  Recommending isolating at home while awaiting test results, use of finger pulse oximeter use at home, advised immediate return if the number is persistently below 90%, if you have  worsening symptoms, trouble breathing or any additional concerns to return immediately back to the ER.  Otherwise advised follow-up with primary care doctor in 2 or 3 days.  Final Clinical Impression(s) / ED Diagnoses Final diagnoses:  Suspected COVID-19 virus infection    Rx / DC Orders ED Discharge Orders    None       , MD 07/03/20 2140

## 2020-07-04 LAB — SARS CORONAVIRUS 2 (TAT 6-24 HRS): SARS Coronavirus 2: NEGATIVE

## 2022-07-22 ENCOUNTER — Emergency Department (HOSPITAL_COMMUNITY)
Admission: EM | Admit: 2022-07-22 | Discharge: 2022-07-22 | Disposition: A | Discharge status: Home / Self Care | Payer: Worker's Compensation | Attending: Emergency Medicine | Admitting: Emergency Medicine

## 2022-07-22 ENCOUNTER — Other Ambulatory Visit: Payer: Self-pay

## 2022-07-22 ENCOUNTER — Encounter (HOSPITAL_COMMUNITY): Payer: Self-pay

## 2022-07-22 ENCOUNTER — Emergency Department (HOSPITAL_COMMUNITY): Payer: Worker's Compensation

## 2022-07-22 DIAGNOSIS — M79641 Pain in right hand: Secondary | ICD-10-CM | POA: Diagnosis present

## 2022-07-22 DIAGNOSIS — W231XXA Caught, crushed, jammed, or pinched between stationary objects, initial encounter: Secondary | ICD-10-CM | POA: Diagnosis not present

## 2022-07-22 DIAGNOSIS — J45909 Unspecified asthma, uncomplicated: Secondary | ICD-10-CM | POA: Insufficient documentation

## 2022-07-22 MED ORDER — IBUPROFEN 800 MG PO TABS
800.0000 mg | ORAL_TABLET | Freq: Once | ORAL | Status: AC
  Administered 2022-07-22: 800 mg via ORAL
  Filled 2022-07-22: qty 1

## 2022-07-22 NOTE — ED Triage Notes (Signed)
Pt arrived vis POV for R hand injury, Some swelling on knuckles(index/ middle finger). Injury occurred approximately 1430. 5/10 pain

## 2022-07-22 NOTE — ED Provider Notes (Signed)
[facility] Provider Note   CSN: [id number] Arrival date & time: 07/22/22  1541     History  Chief Complaint  Patient presents with   Hand Injury    R hand injury, Some swelling on knuckles(index/ middle finger). 5/10 pain    Peggy Nguyen is a 42 y.o. female.  Patient presented to the emergency department complaining of right hand pain near the index finger/middle finger MCP joints.  Patient has swelling in that area.  Patient states she slammed her hand between a rack and the wall by accident at work at approximately 230 this afternoon.  She endorses full range of motion of the fingers.  Patient has past medical history significant for anemia, asthma  HPI     Home Medications Prior to Admission medications   Medication Sig Start Date End Date Taking? Authorizing Provider  albuterol (VENTOLIN HFA) 108 (90 Base) MCG/ACT inhaler Inhale 1-2 puffs into the lungs every 6 (six) hours as needed for wheezing or shortness of breath. 07/03/20   Luna Fuse, MD  ibuprofen (ADVIL) 600 MG tablet Take 1 tablet (600 mg total) by mouth every 6 (six) hours as needed for moderate pain or cramping. 12/14/19   Sherlyn Hay, DO      Allergies    Shellfish allergy    Review of Systems   Review of Systems  Musculoskeletal:  Positive for arthralgias and joint swelling.    Physical Exam Updated Vital Signs BP 123/69 (BP Location: Right Arm)   Pulse 92   Temp 98.3 F (36.8 C) (Oral)   Resp 16   Ht 5' 1"$  (1.549 m)   Wt 73.9 kg   SpO2 100%   BMI 30.80 kg/m  Physical Exam Vitals reviewed.  HENT:     Head: Normocephalic and atraumatic.  Eyes:     Pupils: Pupils are equal, round, and reactive to light.  Cardiovascular:     Rate and Rhythm: Normal rate.  Pulmonary:     Effort: Pulmonary effort is normal. No respiratory distress.  Musculoskeletal:        General: Swelling, tenderness and signs of injury present. No deformity.      Cervical back: Normal range of motion.     Comments: Swelling noted to the MCP joint region, dorsal, right hand, near the index and middle finger. Normal ROM.   Skin:    General: Skin is dry.     Capillary Refill: Capillary refill takes less than 2 seconds.  Neurological:     Mental Status: She is alert.  Psychiatric:        Speech: Speech normal.        Behavior: Behavior normal.     ED Results / Procedures / Treatments   Labs (all labs ordered are listed, but only abnormal results are displayed) Labs Reviewed - No data to display  EKG None  Radiology DG Hand Complete Right  Result Date: 07/22/2022 CLINICAL DATA:  42 year old female with history of pain and swelling in the second and third metacarpals after injury. EXAM: RIGHT HAND - COMPLETE 3+ VIEW COMPARISON:  No priors. FINDINGS: There is no evidence of fracture or dislocation. There is no evidence of arthropathy or other focal bone abnormality. Soft tissues are unremarkable. IMPRESSION: Negative. Electronically Signed   By: Vinnie Langton M.D.   On: 07/22/2022 16:09    Procedures Procedures    Medications Ordered in ED Medications  ibuprofen (ADVIL) tablet 800 mg (has no administration  in time range)    ED Course/ Medical Decision Making/ A&P                             Medical Decision Making Amount and/or Complexity of Data Reviewed Radiology: ordered.   Patient presents with chief complaint of right hand pain/swelling.  Differential diagnosis includes but is not limited to fracture, dislocation, soft tissue injury, and others  Past medical history noncontributory.  Patient does have history of anemia.  There are no relevant medical visits related to this visit  I ordered and interpreted imaging including plain films of the right hand.  No fracture or dislocation was noted.  I agree with radiologist findings  Patient's symptoms seem consistent with soft tissue swelling.  Plan to discharge patient home with  recommendations for over-the-counter medicine including ibuprofen and acetaminophen.  Patient may ice the hand as needed.  I will provide contact information for hand surgery for follow-up if needed.  I feel the follow-up is unlikely necessary.        Final Clinical Impression(s) / ED Diagnoses Final diagnoses:  Right hand pain    Rx / DC Orders ED Discharge Orders     None         Ronny Bacon 07/22/22 1617    Tretha Sciara, MD 07/23/22 1546

## 2022-07-22 NOTE — Discharge Instructions (Signed)
You were evaluated today for pain in the right hand.  Your x-rays were reassuring for no signs of fracture or dislocation.  Your symptoms are consistent with soft tissue swelling.  Please ice and elevate the affected extremity.  Please take over-the-counter medication such as ibuprofen and acetaminophen as needed for inflammation and pain control.  I have provided contact information for the on-call hand surgeon in case you need follow-up.

## 2023-07-05 LAB — LAB REPORT - SCANNED: Calcium: 9.5

## 2023-08-24 ENCOUNTER — Other Ambulatory Visit: Payer: Self-pay

## 2023-08-24 ENCOUNTER — Emergency Department (HOSPITAL_COMMUNITY)
Admission: EM | Admit: 2023-08-24 | Discharge: 2023-08-24 | Disposition: A | Discharge status: Home / Self Care | Payer: Self-pay | Attending: Emergency Medicine | Admitting: Emergency Medicine

## 2023-08-24 ENCOUNTER — Encounter (HOSPITAL_COMMUNITY): Payer: Self-pay

## 2023-08-24 DIAGNOSIS — Z760 Encounter for issue of repeat prescription: Secondary | ICD-10-CM | POA: Insufficient documentation

## 2023-08-24 DIAGNOSIS — J4520 Mild intermittent asthma, uncomplicated: Secondary | ICD-10-CM

## 2023-08-24 DIAGNOSIS — Z7952 Long term (current) use of systemic steroids: Secondary | ICD-10-CM | POA: Diagnosis not present

## 2023-08-24 DIAGNOSIS — R0602 Shortness of breath: Secondary | ICD-10-CM | POA: Diagnosis present

## 2023-08-24 MED ORDER — ALBUTEROL SULFATE HFA 108 (90 BASE) MCG/ACT IN AERS: 1.0000 | INHALATION_SPRAY | Freq: Four times a day (QID) | RESPIRATORY_TRACT | 0 refills | Status: DC | PRN

## 2023-08-24 MED ORDER — ALBUTEROL SULFATE (2.5 MG/3ML) 0.083% IN NEBU: 2.5000 mg | INHALATION_SOLUTION | Freq: Four times a day (QID) | RESPIRATORY_TRACT | 1 refills | Status: DC | PRN

## 2023-08-24 MED ORDER — PREDNISONE 20 MG PO TABS: ORAL_TABLET | ORAL | 0 refills | Status: DC

## 2023-08-24 NOTE — Discharge Instructions (Signed)
 Take the prescribed medication as directed. Follow-up with your primary care doctor when able. Return to the ED for new or worsening symptoms.

## 2023-08-24 NOTE — ED Provider Notes (Signed)
 [place] EMERGENCY DEPARTMENT AT [facility] Provider Note   CSN: [id number] Arrival date & time: 08/24/23  2034     History  Chief Complaint  Patient presents with   Shortness of Breath    Peggy Nguyen is a 43 y.o. female.  The history is provided by the patient and medical records.  Shortness of Breath  43 year old female presenting to the ED with asthma exacerbation.  Reports she ran out of her home neb solution about a week ago and for the past 3 to 4 days has been having increased wheezing and episodes of shortness of breath.  States this is especially worse when she tries to get comfortable to sleep.  Does tend to have issues with her asthma in the spring time with change in allergens.  She denies any fever or chills.  No sick contacts.  Home Medications Prior to Admission medications   Medication Sig Start Date End Date Taking? Authorizing Provider  albuterol (PROVENTIL) (2.5 MG/3ML) 0.083% nebulizer solution Take 3 mLs (2.5 mg total) by nebulization every 6 (six) hours as needed for wheezing or shortness of breath. 08/24/23  Yes Garlon Hatchet, PA-C  albuterol (VENTOLIN HFA) 108 (90 Base) MCG/ACT inhaler Inhale 1-2 puffs into the lungs every 6 (six) hours as needed for wheezing. 08/24/23  Yes Garlon Hatchet, PA-C  ibuprofen (ADVIL) 600 MG tablet Take 1 tablet (600 mg total) by mouth every 6 (six) hours as needed for moderate pain or cramping. 12/14/19   Banga, Sharol Given, DO  predniSONE (DELTASONE) 20 MG tablet Take 40 mg by mouth daily for 3 days, then 20mg  by mouth daily for 3 days, then 10mg  daily for 3 days 08/24/23  Yes Garlon Hatchet, PA-C      Allergies    Shellfish allergy    Review of Systems   Review of Systems  Respiratory:  Positive for shortness of breath.   All other systems reviewed and are negative.   Physical Exam Updated Vital Signs BP (!) 129/92   Pulse 95   Temp 98.6 F (37 C) (Oral)   Resp 18   Ht 5\' 1"  (1.549 m)   Wt 73.9  kg   SpO2 100%   BMI 30.80 kg/m   Physical Exam Vitals and nursing note reviewed.  Constitutional:      Appearance: She is well-developed.  HENT:     Head: Normocephalic and atraumatic.  Eyes:     Conjunctiva/sclera: Conjunctivae normal.     Pupils: Pupils are equal, round, and reactive to light.  Cardiovascular:     Rate and Rhythm: Normal rate and regular rhythm.     Heart sounds: Normal heart sounds.  Pulmonary:     Effort: Pulmonary effort is normal.     Breath sounds: Normal breath sounds. No wheezing or rhonchi.     Comments: Lung sounds are grossly clear, no audible wheezes or rhonchi Abdominal:     General: Bowel sounds are normal.     Palpations: Abdomen is soft.  Musculoskeletal:        General: Normal range of motion.     Cervical back: Normal range of motion.  Skin:    General: Skin is warm and dry.  Neurological:     Mental Status: She is alert and oriented to person, place, and time.     ED Results / Procedures / Treatments   Labs (all labs ordered are listed, but only abnormal results are displayed) Labs Reviewed - No data  to display  EKG None  Radiology No results found.  Procedures Procedures    Medications Ordered in ED Medications - No data to display  ED Course/ Medical Decision Making/ A&P                                 Medical Decision Making Risk Prescription drug management.   43 year old female here with shortness of breath.  Seems to be due to asthma exacerbation.  Lung sounds are grossly clear currently, she is in no distress and does not have any audible wheezing at present.  Denies any cough or fever so doubt infectious pathology.  I have refilled her home inhaler and neb solution, short course prednisone as this typically works best for her.  She does have follow-up with PCP but not until June.  She can return here for any new/acute changes.  Final Clinical Impression(s) / ED Diagnoses Final diagnoses:  Mild intermittent  asthma without complication    Rx / DC Orders ED Discharge Orders          Ordered    albuterol (VENTOLIN HFA) 108 (90 Base) MCG/ACT inhaler  Every 6 hours PRN        08/24/23 2210    albuterol (PROVENTIL) (2.5 MG/3ML) 0.083% nebulizer solution  Every 6 hours PRN        08/24/23 2210    predniSONE (DELTASONE) 20 MG tablet        08/24/23 2210              Garlon Hatchet, PA-C 08/24/23 2215    Elayne Snare K, DO 08/24/23 2333

## 2023-08-24 NOTE — ED Triage Notes (Signed)
 Pt c/o sob for 3 days that is progressively worsening. Hx of asthma, used inhaler with no relief. Pt is out of neb treatments. Respirations equal and unlabored in triage. No audible wheezing.

## 2023-11-06 ENCOUNTER — Encounter (HOSPITAL_COMMUNITY): Payer: Self-pay

## 2023-11-06 ENCOUNTER — Other Ambulatory Visit: Payer: Self-pay

## 2023-11-06 ENCOUNTER — Emergency Department (HOSPITAL_COMMUNITY)
Admission: EM | Admit: 2023-11-06 | Discharge: 2023-11-06 | Disposition: A | Discharge status: Home / Self Care | Attending: Emergency Medicine | Admitting: Emergency Medicine

## 2023-11-06 DIAGNOSIS — J4541 Moderate persistent asthma with (acute) exacerbation: Secondary | ICD-10-CM | POA: Insufficient documentation

## 2023-11-06 DIAGNOSIS — R062 Wheezing: Secondary | ICD-10-CM | POA: Diagnosis present

## 2023-11-06 MED ORDER — ALBUTEROL SULFATE (2.5 MG/3ML) 0.083% IN NEBU
5.0000 mg | INHALATION_SOLUTION | Freq: Once | RESPIRATORY_TRACT | Status: AC
  Administered 2023-11-06: 5 mg via RESPIRATORY_TRACT
  Filled 2023-11-06: qty 6

## 2023-11-06 MED ORDER — ALBUTEROL SULFATE HFA 108 (90 BASE) MCG/ACT IN AERS
2.0000 | INHALATION_SPRAY | RESPIRATORY_TRACT | Status: DC | PRN
  Administered 2023-11-06: 2 via RESPIRATORY_TRACT
  Filled 2023-11-06: qty 6.7

## 2023-11-06 MED ORDER — PREDNISONE 50 MG PO TABS: 50.0000 mg | ORAL_TABLET | Freq: Every day | ORAL | 0 refills | Status: DC

## 2023-11-06 MED ORDER — IPRATROPIUM BROMIDE 0.02 % IN SOLN
0.5000 mg | Freq: Once | RESPIRATORY_TRACT | Status: AC
  Administered 2023-11-06: 0.5 mg via RESPIRATORY_TRACT
  Filled 2023-11-06: qty 2.5

## 2023-11-06 MED ORDER — PREDNISONE 50 MG PO TABS
50.0000 mg | ORAL_TABLET | Freq: Once | ORAL | Status: AC
Start: 2023-11-06 — End: 2023-11-06
  Administered 2023-11-06: 50 mg via ORAL
  Filled 2023-11-06: qty 1

## 2023-11-06 NOTE — ED Provider Notes (Signed)
 [place] EMERGENCY DEPARTMENT AT [facility] Provider Note   CSN: [id number] Arrival date & time: 11/06/23  1739     History  Chief Complaint  Patient presents with   Cough   Shortness of Breath    Peggy Nguyen is a 43 y.o. female.  Patient to ED with increased wheezing and cough uncontrolled with Albuterol  via nebulizer and inhaler at home. No fever. Albuterol  has effect but is needed significantly more frequently than is normal for her. Feels well otherwise.   The history is provided by the patient. No language interpreter was used.  Cough Associated symptoms: shortness of breath   Shortness of Breath Associated symptoms: cough        Home Medications Prior to Admission medications   Medication Sig Start Date End Date Taking? Authorizing Provider  albuterol  (PROVENTIL ) (2.5 MG/3ML) 0.083% nebulizer solution Take 3 mLs (2.5 mg total) by nebulization every 6 (six) hours as needed for wheezing or shortness of breath. 08/24/23   Coretha Dew, PA-C  albuterol  (VENTOLIN  HFA) 108 (90 Base) MCG/ACT inhaler Inhale 1-2 puffs into the lungs every 6 (six) hours as needed for wheezing. 08/24/23   Coretha Dew, PA-C  ibuprofen  (ADVIL ) 600 MG tablet Take 1 tablet (600 mg total) by mouth every 6 (six) hours as needed for moderate pain or cramping. 12/14/19   Banga, Lum Salina, DO  predniSONE  (DELTASONE ) 50 MG tablet Take 1 tablet (50 mg total) by mouth daily. 11/06/23  Yes Mandy Second, PA-C      Allergies    Shellfish allergy    Review of Systems   Review of Systems  Respiratory:  Positive for cough and shortness of breath.     Physical Exam Updated Vital Signs BP 123/75   Pulse (!) 104   Temp 98.4 F (36.9 C) (Oral)   Resp 18   Ht 5\' 1"  (1.549 m)   Wt 72.6 kg   SpO2 99%   BMI 30.23 kg/m  Physical Exam Vitals and nursing note reviewed.  Constitutional:      General: She is not in acute distress.    Appearance: She is well-developed. She is not  ill-appearing.  Pulmonary:     Effort: Pulmonary effort is normal.     Breath sounds: Examination of the right-middle field reveals wheezing. Examination of the left-middle field reveals wheezing. Examination of the right-lower field reveals wheezing. Examination of the left-lower field reveals wheezing. Wheezing present.  Musculoskeletal:        General: Normal range of motion.     Cervical back: Normal range of motion.  Skin:    General: Skin is warm and dry.  Neurological:     Mental Status: She is alert and oriented to person, place, and time.     ED Results / Procedures / Treatments   Labs (all labs ordered are listed, but only abnormal results are displayed) Labs Reviewed - No data to display  EKG None  Radiology No results found.  Procedures Procedures    Medications Ordered in ED Medications  albuterol  (VENTOLIN  HFA) 108 (90 Base) MCG/ACT inhaler 2 puff (has no administration in time range)  albuterol  (PROVENTIL ) (2.5 MG/3ML) 0.083% nebulizer solution 5 mg (5 mg Nebulization Given 11/06/23 2000)  ipratropium (ATROVENT) nebulizer solution 0.5 mg (0.5 mg Nebulization Given 11/06/23 2000)  predniSONE  (DELTASONE ) tablet 50 mg (50 mg Oral Given 11/06/23 2000)    ED Course/ Medical Decision Making/ A&P Clinical Course as of 11/06/23 2136  Tue Nov 06, 2023  2111 Patient seen initially with inspiratory of expiratory wheezing. DuoNeb ordered with resolution. Prednisone  started. No fever - do not suspect PNA, viral process such as influenza or COVID. No hypoxia. Plan reassessment in 20-30 minutes to ensure no recurrent wheezing.  [SU]  2132 No further wheezing. She is feeling better. She is felt stable for discharge home. Will Rx prednisone  for another 3 days. Encourage PCP follow up in a week. Return precautions discussed.  [SU]    Clinical Course User Index [SU] Mandy Second, PA-C                                 Medical Decision Making Risk Prescription drug  management.           Final Clinical Impression(s) / ED Diagnoses Final diagnoses:  Moderate persistent asthma with exacerbation    Rx / DC Orders ED Discharge Orders          Ordered    predniSONE  (DELTASONE ) 50 MG tablet  Daily        11/06/23 2134              Mandy Second, PA-C 11/06/23 2136    Dorenda Gandy, MD 11/06/23 2325

## 2023-11-06 NOTE — Discharge Instructions (Signed)
 Take the prednisone  once daily for another 3 days. Use your Albuterol  nebulizer every 4-6 hours as needed, and your Albuterol  inhaler if needed.   If symptoms worsen, return to the ED for further evaluation. Otherwise, follow up with your doctor in one week for a recheck.

## 2023-11-06 NOTE — ED Triage Notes (Signed)
 Pt c/o increasing SOB and non-productive cough x5 days.  Denies pain.  Hx of asthma.  Pt reports using neb machine w/o relief.    Pt able to speak full sentences.

## 2023-11-29 ENCOUNTER — Encounter (HOSPITAL_COMMUNITY): Payer: Self-pay

## 2023-11-29 ENCOUNTER — Emergency Department (HOSPITAL_COMMUNITY)
Admission: EM | Admit: 2023-11-29 | Discharge: 2023-11-29 | Disposition: A | Discharge status: Home / Self Care | Attending: Emergency Medicine | Admitting: Emergency Medicine

## 2023-11-29 DIAGNOSIS — J4531 Mild persistent asthma with (acute) exacerbation: Secondary | ICD-10-CM | POA: Insufficient documentation

## 2023-11-29 DIAGNOSIS — R0602 Shortness of breath: Secondary | ICD-10-CM | POA: Diagnosis present

## 2023-11-29 MED ORDER — ALBUTEROL SULFATE (2.5 MG/3ML) 0.083% IN NEBU: 2.5000 mg | INHALATION_SOLUTION | Freq: Four times a day (QID) | RESPIRATORY_TRACT | 1 refills | Status: DC | PRN

## 2023-11-29 MED ORDER — ALBUTEROL SULFATE HFA 108 (90 BASE) MCG/ACT IN AERS
1.0000 | INHALATION_SPRAY | Freq: Once | RESPIRATORY_TRACT | Status: AC
  Administered 2023-11-29: 2 via RESPIRATORY_TRACT
  Filled 2023-11-29: qty 6.7

## 2023-11-29 NOTE — ED Provider Notes (Signed)
 [place] EMERGENCY DEPARTMENT AT [facility] Provider Note   CSN: [id number] Arrival date & time: 11/29/23  2216     Patient presents with: Asthma   Peggy Nguyen is a 43 y.o. female.   43 year old female with a past medical history of asthma presents to the ED with a chief complaint of shortness of breath, wheezing for the past month.  Previously evaluated on November 06, 2023 here, given steroids, inhaler, nebulizer treatment.  She reports her symptoms have not improved.  She has had increased use of her inhaler.  Does have a dry cough, no fever, no sick contacts.  Has not seen a pulmonologist in some time for her asthma. NO chest pain.   The history is provided by the patient and medical records.  Asthma Associated symptoms include shortness of breath.       Prior to Admission medications   Medication Sig Start Date End Date Taking? Authorizing Provider  albuterol  (PROVENTIL ) (2.5 MG/3ML) 0.083% nebulizer solution Take 3 mLs (2.5 mg total) by nebulization every 6 (six) hours as needed for wheezing or shortness of breath. 11/29/23   Arien Morine, PA-C  ibuprofen  (ADVIL ) 600 MG tablet Take 1 tablet (600 mg total) by mouth every 6 (six) hours as needed for moderate pain or cramping. 12/14/19   Banga, Ted Morrison, DO  predniSONE  (DELTASONE ) 50 MG tablet Take 1 tablet (50 mg total) by mouth daily. 11/06/23   Odell Balls, PA-C    Allergies: Shellfish allergy    Review of Systems  Constitutional:  Negative for fever.  Respiratory:  Positive for shortness of breath.     Updated Vital Signs BP 136/86   Pulse 86   Temp 98.4 F (36.9 C)   Resp 18   SpO2 100%   Physical Exam Vitals and nursing note reviewed.  Constitutional:      Appearance: Normal appearance.  HENT:     Head: Normocephalic and atraumatic.     Mouth/Throat:     Mouth: Mucous membranes are moist.   Cardiovascular:     Rate and Rhythm: Normal rate.  Pulmonary:     Effort: Pulmonary effort is  normal.     Breath sounds: Wheezing (mild wheezing) present.  Abdominal:     General: Abdomen is flat.     Palpations: Abdomen is soft.     Tenderness: There is no abdominal tenderness.   Musculoskeletal:     Cervical back: Normal range of motion and neck supple.   Skin:    General: Skin is warm and dry.   Neurological:     Mental Status: She is alert and oriented to person, place, and time.     (all labs ordered are listed, but only abnormal results are displayed) Labs Reviewed - No data to display  EKG: None  Radiology: No results found.   Procedures   Medications Ordered in the ED  albuterol  (VENTOLIN  HFA) 108 (90 Base) MCG/ACT inhaler 1-2 puff (has no administration in time range)                                    Medical Decision Making Risk Prescription drug management.   Patient presented to the ED with a chief complaint of shortness of breath has been ongoing for the past month.  She reports her asthma has gotten worse over the past month.  She has not seen any pulmonologist for the symptoms.  She does report having a steroid burst earlier this month.  Here she has some mild wheezing, no fever, dry cough.  No hypoxia, no chest pain, no tachycardia.  Do not suspect any further emergent workup at this time.  We discussed refill of albuterol  inhaler, will also have nebulizer prescribed.  I did suggest with her not redoing steroid burst at this time it has been less than a month since she had her last prescription.  Her vitals are stable, do not suspect any further workup at this time.  Hemodynamically stable for discharge.   Portions of this note were generated with Scientist, clinical (histocompatibility and immunogenetics). Dictation errors may occur despite best attempts at proofreading.     Final diagnoses:  Mild persistent asthma with exacerbation    ED Discharge Orders          Ordered    albuterol  (PROVENTIL ) (2.5 MG/3ML) 0.083% nebulizer solution  Every 6 hours PRN         11/29/23 2249               Aysia Lowder, PA-C 11/29/23 2304    Dean Clarity, MD 11/29/23 2312

## 2023-11-29 NOTE — Discharge Instructions (Addendum)
 You are given a new inhaler on today's visit.  You are also given a new nebulizer prescription.  You need to schedule an appointment with a pulmonologist in order to have your asthma further evaluated.

## 2023-11-29 NOTE — ED Triage Notes (Signed)
 Pt states that she has been having SOB and wheezing for the past month, pt is out of her albuterol  nebulizer solution at home

## 2024-06-26 ENCOUNTER — Encounter (HOSPITAL_COMMUNITY): Payer: Self-pay | Admitting: Emergency Medicine

## 2024-06-26 ENCOUNTER — Emergency Department (HOSPITAL_COMMUNITY)
Admission: EM | Admit: 2024-06-26 | Discharge: 2024-06-26 | Disposition: A | Discharge status: Home / Self Care | Payer: Self-pay | Attending: Emergency Medicine | Admitting: Emergency Medicine

## 2024-06-26 ENCOUNTER — Other Ambulatory Visit: Payer: Self-pay

## 2024-06-26 ENCOUNTER — Emergency Department (HOSPITAL_COMMUNITY): Payer: Self-pay

## 2024-06-26 DIAGNOSIS — J069 Acute upper respiratory infection, unspecified: Secondary | ICD-10-CM | POA: Insufficient documentation

## 2024-06-26 DIAGNOSIS — J4521 Mild intermittent asthma with (acute) exacerbation: Secondary | ICD-10-CM | POA: Insufficient documentation

## 2024-06-26 DIAGNOSIS — K047 Periapical abscess without sinus: Secondary | ICD-10-CM | POA: Insufficient documentation

## 2024-06-26 LAB — RESP PANEL BY RT-PCR (RSV, FLU A&B, COVID)  RVPGX2
Influenza A by PCR: NEGATIVE
Influenza B by PCR: NEGATIVE
Resp Syncytial Virus by PCR: NEGATIVE
SARS Coronavirus 2 by RT PCR: NEGATIVE

## 2024-06-26 MED ORDER — ALBUTEROL SULFATE (2.5 MG/3ML) 0.083% IN NEBU: 2.5000 mg | INHALATION_SOLUTION | Freq: Four times a day (QID) | RESPIRATORY_TRACT | 1 refills | Status: AC | PRN

## 2024-06-26 MED ORDER — AMOXICILLIN-POT CLAVULANATE 875-125 MG PO TABS: 1.0000 | ORAL_TABLET | Freq: Two times a day (BID) | ORAL | 0 refills | Status: AC

## 2024-06-26 MED ORDER — PREDNISONE 10 MG (21) PO TBPK: ORAL_TABLET | Freq: Every day | ORAL | 0 refills | Status: AC

## 2024-06-26 MED ORDER — AMOXICILLIN-POT CLAVULANATE 875-125 MG PO TABS
1.0000 | ORAL_TABLET | Freq: Once | ORAL | Status: AC
  Administered 2024-06-26: 1 via ORAL
  Filled 2024-06-26: qty 1

## 2024-06-26 MED ORDER — IPRATROPIUM-ALBUTEROL 0.5-2.5 (3) MG/3ML IN SOLN
3.0000 mL | Freq: Once | RESPIRATORY_TRACT | Status: AC
  Administered 2024-06-26: 3 mL via RESPIRATORY_TRACT
  Filled 2024-06-26: qty 3

## 2024-06-26 MED ORDER — ALBUTEROL SULFATE HFA 108 (90 BASE) MCG/ACT IN AERS: 1.0000 | INHALATION_SPRAY | Freq: Four times a day (QID) | RESPIRATORY_TRACT | 1 refills | Status: AC | PRN

## 2024-06-26 MED ORDER — ALBUTEROL SULFATE HFA 108 (90 BASE) MCG/ACT IN AERS
1.0000 | INHALATION_SPRAY | Freq: Once | RESPIRATORY_TRACT | Status: AC
  Administered 2024-06-26: 1 via RESPIRATORY_TRACT
  Filled 2024-06-26: qty 6.7

## 2024-06-26 NOTE — ED Provider Notes (Signed)
 " Peggy Nguyen EMERGENCY DEPARTMENT AT [facility] Provider Note   CSN: [id number] Arrival date & time: 06/26/24  8397     Patient presents with: Cough   Peggy Nguyen is a 44 y.o. female.   Peggy Nguyen is a 44 y.o. female presenting for chronic cough with history of asthma. Also would like to be seen for dental pain.   Cough: present for one week. Persists despite home nebulizer and albuterol  inhaler. The cough is usually mild but can become severe, worsens at night when lying flat, and wakes them from sleep. They have asthma and last saw a provider for this about a year ago. They previously used Trelegy when they had insurance. They have had a slight fever at home and deny sick contacts.  Dental pain: started after cracking a tooth before Christmas, with pain and tenderness around an upper molar and another back molar. They use ibuprofen  and salt water rinses for relief. They have a dentist visit planned in two weeks.   Cough Associated symptoms: no chest pain, no chills, no fever, no shortness of breath and no wheezing        Prior to Admission medications  Medication Sig Start Date End Date Taking? Authorizing Provider  albuterol  (VENTOLIN  HFA) 108 (90 Base) MCG/ACT inhaler Inhale 1-2 puffs into the lungs every 6 (six) hours as needed for wheezing or shortness of breath. 06/26/24  Yes Elnor Savant A, DO  amoxicillin -clavulanate (AUGMENTIN ) 875-125 MG tablet Take 1 tablet by mouth every 12 (twelve) hours. 06/26/24  Yes Cleotilde Perkins, DO  predniSONE  (STERAPRED UNI-PAK 21 TAB) 10 MG (21) TBPK tablet Take by mouth daily. Take 6 tabs by mouth daily  for 2 days, then 5 tabs for 2 days, then 4 tabs for 2 days, then 3 tabs for 2 days, 2 tabs for 2 days, then 1 tab by mouth daily for 2 days 06/26/24  Yes Elnor Savant A, DO  albuterol  (PROVENTIL ) (2.5 MG/3ML) 0.083% nebulizer solution Take 3 mLs (2.5 mg total) by nebulization every 6 (six) hours as needed for wheezing or shortness of  breath. 06/26/24   Elnor Savant LABOR, DO  ibuprofen  (ADVIL ) 600 MG tablet Take 1 tablet (600 mg total) by mouth every 6 (six) hours as needed for moderate pain or cramping. 12/14/19   Delana Ted Morrison, DO    Allergies: Shellfish allergy    Review of Systems  Constitutional:  Negative for chills, fatigue and fever.  Respiratory:  Positive for cough. Negative for choking, chest tightness, shortness of breath, wheezing and stridor.   Cardiovascular:  Negative for chest pain and palpitations.  Gastrointestinal:  Negative for diarrhea, nausea and vomiting.    Updated Vital Signs BP (!) 154/91 (BP Location: Right Arm)   Pulse 79   Temp 98.9 F (37.2 C) (Oral)   Resp 16   LMP 06/23/2024 (Exact Date)   SpO2 99%   Physical Exam Constitutional:      General: She is not in acute distress.    Appearance: Normal appearance. She is normal weight.  HENT:     Nose: Nose normal. No congestion.     Mouth/Throat:     Mouth: Mucous membranes are dry.  Cardiovascular:     Pulses: Normal pulses.     Heart sounds: Normal heart sounds. No murmur heard. Pulmonary:     Effort: Pulmonary effort is normal. No respiratory distress.     Comments: Mild expiratory wheeze  Abdominal:     General: Abdomen is flat.  Bowel sounds are normal.     Palpations: Abdomen is soft.  Skin:    General: Skin is warm.     Capillary Refill: Capillary refill takes less than 2 seconds.  Neurological:     General: No focal deficit present.     Mental Status: She is alert and oriented to person, place, and time.     (all labs ordered are listed, but only abnormal results are displayed) Labs Reviewed  RESP PANEL BY RT-PCR (RSV, FLU A&B, COVID)  [id number]    EKG: None  Radiology: DG Chest 2 View Result Date: 06/26/2024 CLINICAL DATA:  Cough. EXAM: CHEST - 2 VIEW COMPARISON:  Chest radiograph dated 08/23/2020. FINDINGS: The heart size and mediastinal contours are within normal limits. Both lungs are clear. The  visualized skeletal structures are unremarkable. IMPRESSION: No active cardiopulmonary disease. Electronically Signed   By: Vanetta Chou M.D.   On: 06/26/2024 17:32     Procedures   Medications Ordered in the ED  albuterol  (VENTOLIN  HFA) 108 (90 Base) MCG/ACT inhaler 1-2 puff (has no administration in time range)  ipratropium-albuterol  (DUONEB) 0.5-2.5 (3) MG/3ML nebulizer solution 3 mL (3 mLs Nebulization Given 06/26/24 1805)  amoxicillin -clavulanate (AUGMENTIN ) 875-125 MG per tablet 1 tablet (1 tablet Oral Given 06/26/24 1805)                                    Medical Decision Making Peggy Nguyen is a 44 y.o. female presenting for acute cough and dental pain. On exam she is well appearing with NAD and mild expiratory wheeze. Noted to have abscess on right upper molar. Differential includes URI, asthma exacerbation, uncontrolled asthma. Will give DuoNeb and reassess. Quad screen negative.   Breathing improved s/p DuoNeb. Suspect viral URI without severe asthma exacerbation. Continue albuterol  inhaler at home. Will prescribe Augmentin  875 mg BID for 7 days for dental abscess. Recommend following up with outpatient dentist for treatment.   Amount and/or Complexity of Data Reviewed Radiology: ordered.  Risk Prescription drug management.       Final diagnoses:  Viral upper respiratory tract infection  Dental abscess  Mild intermittent asthma with exacerbation    ED Discharge Orders          Ordered    amoxicillin -clavulanate (AUGMENTIN ) 875-125 MG tablet  Every 12 hours        06/26/24 1822    albuterol  (PROVENTIL ) (2.5 MG/3ML) 0.083% nebulizer solution  Every 6 hours PRN        06/26/24 1826    albuterol  (VENTOLIN  HFA) 108 (90 Base) MCG/ACT inhaler  Every 6 hours PRN        06/26/24 1826    predniSONE  (STERAPRED UNI-PAK 21 TAB) 10 MG (21) TBPK tablet  Daily        06/26/24 1826               Cleotilde Perkins, DO 06/26/24 1828    Elnor Jayson LABOR, DO 06/28/24  1524  "

## 2024-06-26 NOTE — Discharge Instructions (Addendum)
 " It was a pleasure caring for you today in the emergency department.  Follow up with your dentist, please gargle with warm salt water 3x daily. Avoid tobacco use. Please see dentist  Please return to the emergency department for any worsening or worrisome symptoms.   RESOURCE GUIDE  Chronic Pain Problems: Contact [facility]  [phone] Patients need to be referred by their primary care doctor.  Insufficient Money for Medicine: Contact [employer]:  call [phone]  No Primary Care Doctor: Call [facility]  [phone] - can help you locate a primary care doctor that  accepts your insurance, provides certain services, etc. Physician Referral Service- [phone]  Agencies that provide inexpensive medical care: [facility]  [phone] [facility]  [phone] [facility]  [phone]  [facility]  [phone] [facility]  [phone] [facility]  [phone]  Medicaid-accepting [facility] Providers: [facility]- [address], [address]  [phone], Mon-Fri 9am-7pm, Sat 9am-1pm [facility]- [address] [place], [address] [place]  [phone] [facility]- [address] [place]  [phone] [facility]- [address]  [phone] [facility]- [address] [place], [address], [phone]  Only accepts [place] patients after they have their name  applied to their card  Self Pay (no insurance) in [facility]: Sickle Cell Patients - [facility]  [address] [place], [phone] [facility]- [address] [place]  [phone]       [place] [facility]- [address]       -     [facility]- see information above (Speak to [employer] if you do not have insurance)       -  [facility]- [address],  [phone]       -  [facility]-  [address] [phone]       -  [facility]-  [address], [place], [phone]       -  [facility] - [address], [phone]       -  [facility]- [address], [phone], also [address] [phone]       -     [facility]- [address] [place], [phone], 1st & 3rd Saturday         every month, 10am-1pm  -     [facility]   [address] [place], [place].   Phone:  [phone], Fax:  [phone]. Hours of Operation:  9 am - 6 pm, M-F.  -     [facility]   [address], [address],    Phone: [phone], Fax: [phone]. Hours of Operation:  8:30 am - 5:30 pm, M-F.    Dental Assistance If unable to pay or uninsured, contact:  [facility] to become qualified for the adult dental clinic.  Patients with Medicaid: [facility] [phone] [address], [phone] [address] [phone]  If unable to pay, or uninsured, contact [facility] [phone] in [place], [phone] in [facility]) to become qualified for the adult dental clinic  [facility] [address] [place], [place] [address] [phone] [email or web address]  Other It Consultant: [facility]- [address] [place], [place], [place], [address], [phone], Ext. [phone], 2nd and 4th Thursday of the month at 6:30am.  10 clients each day by appointment, can sometimes see walk-in patients if someone does not show for an appointment. [facility]- [address] [place], [place], [address], [phone] [facility] [address] [place], [place], [address], [phone] [facility]- [phone] [facility]- [phone] [facility] [phone]     "

## 2024-06-26 NOTE — ED Triage Notes (Signed)
 Pt presents with dry cough worse with lying down.  Hx of asthma. Has used neb and inhaler at home without too much relief.  Pt also reports she cracked a tooth on the upper right over Christmas and now believes it has developed infection.
